# Patient Record
Sex: Male | Born: 1975 | Race: White | Hispanic: No | Marital: Single | Smoking: Never smoker
Health system: Southern US, Community
[De-identification: ages and names within clinical notes are randomized; demographics above are authoritative.]

## PROBLEM LIST (undated history)

## (undated) DIAGNOSIS — N201 Calculus of ureter: Secondary | ICD-10-CM

## (undated) DIAGNOSIS — Z87442 Personal history of urinary calculi: Secondary | ICD-10-CM

## (undated) DIAGNOSIS — N200 Calculus of kidney: Secondary | ICD-10-CM

## (undated) HISTORY — PX: NO PAST SURGERIES: SHX2092

---

## 2010-06-02 ENCOUNTER — Emergency Department (HOSPITAL_COMMUNITY): Payer: BC Managed Care – PPO

## 2010-06-02 ENCOUNTER — Emergency Department (HOSPITAL_COMMUNITY)
Admission: EM | Admit: 2010-06-02 | Discharge: 2010-06-02 | Disposition: A | Discharge status: Home / Self Care | Payer: BC Managed Care – PPO | Attending: Emergency Medicine | Admitting: Emergency Medicine

## 2010-06-02 DIAGNOSIS — K7689 Other specified diseases of liver: Secondary | ICD-10-CM | POA: Insufficient documentation

## 2010-06-02 DIAGNOSIS — R109 Unspecified abdominal pain: Secondary | ICD-10-CM | POA: Insufficient documentation

## 2010-06-02 DIAGNOSIS — N201 Calculus of ureter: Secondary | ICD-10-CM | POA: Insufficient documentation

## 2010-06-02 LAB — URINALYSIS, ROUTINE W REFLEX MICROSCOPIC
Ketones, ur: NEGATIVE mg/dL
Leukocytes, UA: NEGATIVE
Protein, ur: 30 mg/dL — AB
Urobilinogen, UA: 0.2 mg/dL (ref 0.0–1.0)

## 2010-06-02 LAB — POCT I-STAT, CHEM 8
Hemoglobin: 15 g/dL (ref 13.0–17.0)
Sodium: 143 mEq/L (ref 135–145)
TCO2: 24 mmol/L (ref 0–100)

## 2010-06-02 LAB — URINE MICROSCOPIC-ADD ON

## 2010-06-04 ENCOUNTER — Ambulatory Visit (HOSPITAL_COMMUNITY)
Admission: EM | Admit: 2010-06-04 | Discharge: 2010-06-04 | Disposition: A | Discharge status: Home / Self Care | Payer: BC Managed Care – PPO | Source: Ambulatory Visit | Attending: Urology | Admitting: Urology

## 2010-06-04 ENCOUNTER — Ambulatory Visit (HOSPITAL_COMMUNITY): Payer: BC Managed Care – PPO

## 2010-06-04 DIAGNOSIS — N201 Calculus of ureter: Secondary | ICD-10-CM | POA: Insufficient documentation

## 2010-06-04 DIAGNOSIS — E669 Obesity, unspecified: Secondary | ICD-10-CM | POA: Insufficient documentation

## 2010-06-04 HISTORY — PX: EXTRACORPOREAL SHOCK WAVE LITHOTRIPSY: SHX1557

## 2013-02-25 ENCOUNTER — Other Ambulatory Visit: Payer: Self-pay | Admitting: Internal Medicine

## 2013-03-01 LAB — HEPATITIS A ANTIBODY, TOTAL: Hep A Total Ab: NONREACTIVE

## 2013-03-01 LAB — HEPATITIS C ANTIBODY: HCV Ab: NEGATIVE

## 2013-03-01 LAB — HEPATITIS B CORE ANTIBODY, TOTAL: Hep B Core Total Ab: NONREACTIVE

## 2013-03-04 LAB — HEPATITIS B E ANTIBODY: Hepatitis Be Antibody: NEGATIVE

## 2013-07-25 ENCOUNTER — Other Ambulatory Visit: Payer: Self-pay | Admitting: Internal Medicine

## 2014-02-17 ENCOUNTER — Other Ambulatory Visit: Payer: Self-pay | Admitting: Physician Assistant

## 2014-02-27 DIAGNOSIS — E559 Vitamin D deficiency, unspecified: Secondary | ICD-10-CM | POA: Insufficient documentation

## 2014-02-27 DIAGNOSIS — R0989 Other specified symptoms and signs involving the circulatory and respiratory systems: Secondary | ICD-10-CM | POA: Insufficient documentation

## 2014-02-27 DIAGNOSIS — E291 Testicular hypofunction: Secondary | ICD-10-CM | POA: Insufficient documentation

## 2014-03-01 ENCOUNTER — Encounter: Payer: Self-pay | Admitting: Internal Medicine

## 2014-06-01 ENCOUNTER — Encounter: Payer: Self-pay | Admitting: Internal Medicine

## 2014-06-09 ENCOUNTER — Ambulatory Visit (INDEPENDENT_AMBULATORY_CARE_PROVIDER_SITE_OTHER): Payer: Self-pay | Admitting: Internal Medicine

## 2014-06-09 DIAGNOSIS — R69 Illness, unspecified: Secondary | ICD-10-CM

## 2014-06-09 NOTE — Progress Notes (Signed)
Patient ID: Darren Reynolds, male   DOB: [birth year], 39 y.o.   MRN: [id number]  [facility]  O    S  H  O  W

## 2014-12-01 ENCOUNTER — Emergency Department (HOSPITAL_COMMUNITY)
Admission: EM | Admit: 2014-12-01 | Discharge: 2014-12-02 | Disposition: A | Discharge status: Home / Self Care | Payer: Self-pay | Attending: Emergency Medicine | Admitting: Emergency Medicine

## 2014-12-01 ENCOUNTER — Encounter (HOSPITAL_COMMUNITY): Payer: Self-pay | Admitting: Emergency Medicine

## 2014-12-01 DIAGNOSIS — E559 Vitamin D deficiency, unspecified: Secondary | ICD-10-CM | POA: Insufficient documentation

## 2014-12-01 DIAGNOSIS — Z79899 Other long term (current) drug therapy: Secondary | ICD-10-CM | POA: Insufficient documentation

## 2014-12-01 DIAGNOSIS — I10 Essential (primary) hypertension: Secondary | ICD-10-CM | POA: Insufficient documentation

## 2014-12-01 DIAGNOSIS — N390 Urinary tract infection, site not specified: Secondary | ICD-10-CM | POA: Insufficient documentation

## 2014-12-01 DIAGNOSIS — Z8639 Personal history of other endocrine, nutritional and metabolic disease: Secondary | ICD-10-CM | POA: Insufficient documentation

## 2014-12-01 DIAGNOSIS — Z87442 Personal history of urinary calculi: Secondary | ICD-10-CM | POA: Insufficient documentation

## 2014-12-01 HISTORY — DX: Calculus of kidney: N20.0

## 2014-12-01 LAB — URINALYSIS, ROUTINE W REFLEX MICROSCOPIC
Bilirubin Urine: NEGATIVE
Glucose, UA: 100 mg/dL — AB
KETONES UR: NEGATIVE mg/dL
NITRITE: POSITIVE — AB
PROTEIN: 30 mg/dL — AB
Specific Gravity, Urine: 1.024 (ref 1.005–1.030)
Urobilinogen, UA: 0.2 mg/dL (ref 0.0–1.0)
pH: 7 (ref 5.0–8.0)

## 2014-12-01 LAB — URINE MICROSCOPIC-ADD ON

## 2014-12-01 MED ORDER — CEPHALEXIN 500 MG PO CAPS
500.0000 mg | ORAL_CAPSULE | Freq: Once | ORAL | Status: AC
  Administered 2014-12-02: 500 mg via ORAL
  Filled 2014-12-01: qty 1

## 2014-12-01 MED ORDER — PHENAZOPYRIDINE HCL 100 MG PO TABS: 100.0000 mg | ORAL_TABLET | Freq: Three times a day (TID) | ORAL | Status: DC | PRN

## 2014-12-01 MED ORDER — CEPHALEXIN 500 MG PO CAPS: 500.0000 mg | ORAL_CAPSULE | Freq: Four times a day (QID) | ORAL | Status: DC

## 2014-12-01 MED ORDER — PHENAZOPYRIDINE HCL 100 MG PO TABS
100.0000 mg | ORAL_TABLET | Freq: Three times a day (TID) | ORAL | Status: DC
  Administered 2014-12-02: 100 mg via ORAL
  Filled 2014-12-01: qty 1

## 2014-12-01 NOTE — ED Notes (Signed)
Pt says he felt "pressure down there" yesterday. Took AZO yesterday which relieved the pressure but did not today. Has had increased pressure today with burning at the end of urination. Has had UTIs and kidney stones before and says this feels like a UTI. No other c/c.

## 2014-12-01 NOTE — Discharge Instructions (Signed)
Urinary Tract Infection Follow up with [facility] for further evaluation of UTI. Take antibiotics as prescribed.  Urinary tract infections (UTIs) can develop anywhere along your urinary tract. Your urinary tract is your body's drainage system for removing wastes and extra water. Your urinary tract includes two kidneys, two ureters, a bladder, and a urethra. Your kidneys are a pair of bean-shaped organs. Each kidney is about the size of your fist. They are located below your ribs, one on each side of your spine. CAUSES Infections are caused by microbes, which are microscopic organisms, including fungi, viruses, and bacteria. These organisms are so small that they can only be seen through a microscope. Bacteria are the microbes that most commonly cause UTIs. SYMPTOMS  Symptoms of UTIs may vary by age and gender of the patient and by the location of the infection. Symptoms in young women typically include a frequent and intense urge to urinate and a painful, burning feeling in the bladder or urethra during urination. Older women and men are more likely to be tired, shaky, and weak and have muscle aches and abdominal pain. A fever may mean the infection is in your kidneys. Other symptoms of a kidney infection include pain in your back or sides below the ribs, nausea, and vomiting. DIAGNOSIS To diagnose a UTI, your caregiver will ask you about your symptoms. Your caregiver also will ask to provide a urine sample. The urine sample will be tested for bacteria and white blood cells. White blood cells are made by your body to help fight infection. TREATMENT  Typically, UTIs can be treated with medication. Because most UTIs are caused by a bacterial infection, they usually can be treated with the use of antibiotics. The choice of antibiotic and length of treatment depend on your symptoms and the type of bacteria causing your infection. HOME CARE INSTRUCTIONS  If you were prescribed antibiotics, take them  exactly as your caregiver instructs you. Finish the medication even if you feel better after you have only taken some of the medication.  Drink enough water and fluids to keep your urine clear or pale yellow.  Avoid caffeine, tea, and carbonated beverages. They tend to irritate your bladder.  Empty your bladder often. Avoid holding urine for long periods of time.  Empty your bladder before and after sexual intercourse.  After a bowel movement, women should cleanse from front to back. Use each tissue only once. SEEK MEDICAL CARE IF:   You have back pain.  You develop a fever.  Your symptoms do not begin to resolve within 3 days. SEEK IMMEDIATE MEDICAL CARE IF:   You have severe back pain or lower abdominal pain.  You develop chills.  You have nausea or vomiting.  You have continued burning or discomfort with urination. MAKE SURE YOU:   Understand these instructions.  Will watch your condition.  Will get help right away if you are not doing well or get worse. Document Released: 01/23/2005 Document Revised: 10/15/2011 Document Reviewed: 05/24/2011 [facility] Patient Information 2015 [place], [place]. This information is not intended to replace advice given to you by your health care provider. Make sure you discuss any questions you have with your health care provider.

## 2014-12-01 NOTE — ED Provider Notes (Signed)
CSN: [id number]     Arrival date & time 12/01/14  2145 History   First MD Initiated Contact with Patient 12/01/14 2227     Chief Complaint  Patient presents with  . Bladder pressure   . Urinary Tract Infection     (Consider location/radiation/quality/duration/timing/severity/associated sxs/prior Treatment) The history is provided by the patient and a relative. No language interpreter was used.   Darren Reynolds is a 39 y.o male with a hsitory of hypertension, kidney stones, UTI who presents for pelvic pressure for the past 5 days with dysuria and hematura. He states he took AZO yesterday and pickle juice for the past 5 days which relieved the pressure but his symptoms returned today. He states the he had this in the past he had a UTI after having kidney stones. He states he had a fever 2 days ago. He denies having multiple sexual partners or a history of STD's. He denies any nausea, vomiting, urinary frequency, testicular or penile pain, back pain.  Past Medical History  Diagnosis Date  . Hypogonadism in male   . Vitamin D deficiency   . Labile hypertension   . Nephrolithiasis    History reviewed. No pertinent past surgical history. Family History  Problem Relation Age of Onset  . Heart disease Paternal Uncle   . Stroke Maternal Grandmother   . Hypertension Maternal Grandfather   . Stroke Maternal Grandfather   . Heart attack Maternal Grandfather    History  Substance Use Topics  . Smoking status: Never Smoker   . Smokeless tobacco: Never Used  . Alcohol Use: No    Review of Systems  Constitutional: Negative for chills.  Gastrointestinal: Negative for abdominal pain.  Genitourinary: Positive for dysuria and hematuria. Negative for decreased urine volume, discharge, penile pain and testicular pain.  All other systems reviewed and are negative.     Allergies  Review of patient's allergies indicates no known allergies.  Home Medications   Prior to Admission medications    Medication Sig Start Date End Date Taking? Authorizing Provider  naproxen sodium (ANAPROX) 220 MG tablet Take 440 mg by mouth 2 (two) times daily with a meal.   Yes Historical Provider, MD  cephALEXin (KEFLEX) 500 MG capsule Take 1 capsule (500 mg total) by mouth 4 (four) times daily. 12/01/14   Deb Loudin Patel-Mills, PA-C  citalopram (CELEXA) 40 MG tablet TAKE ONE TABLET BY MOUTH ONE TIME DAILY FOR MOOD Patient not taking: Reported on 12/01/2014 02/19/14   Quentin Mulling, PA-C  phenazopyridine (PYRIDIUM) 100 MG tablet Take 1 tablet (100 mg total) by mouth 3 (three) times daily as needed for pain. 12/01/14   Beuford Garcilazo Patel-Mills, PA-C   BP 125/90 mmHg  Pulse 93  Temp(Src) 98.6 F (37 C) (Oral)  Resp 16  Ht  (1.778 m)  Wt 292 lb 3.2 oz (132.541 kg)  BMI 41.93 kg/m2  SpO2 99% Physical Exam  Constitutional: He is oriented to person, place, and time. He appears well-developed and well-nourished. No distress.  HENT:  Head: Normocephalic and atraumatic.  Eyes: Conjunctivae are normal.  Neck: Normal range of motion. Neck supple.  Cardiovascular: Normal rate, regular rhythm and normal heart sounds.   Pulmonary/Chest: Effort normal and breath sounds normal.  Abdominal: Soft. He exhibits no distension and no mass. There is no tenderness. There is no rigidity, no rebound, no guarding and no CVA tenderness.  No pelvic or CVA tenderness to palpation.  Musculoskeletal: Normal range of motion.  Neurological: He is alert and oriented  to person, place, and time.  Skin: Skin is warm and dry.  Psychiatric: He has a normal mood and affect. His behavior is normal.  Nursing note and vitals reviewed.   ED Course  Procedures (including critical care time) Labs Review Labs Reviewed  URINALYSIS, ROUTINE W REFLEX MICROSCOPIC (NOT AT [facility]) - Abnormal; Notable for the following:    APPearance CLOUDY (*)    Glucose, UA 100 (*)    Hgb urine dipstick LARGE (*)    Protein, ur 30 (*)    Nitrite POSITIVE (*)     Leukocytes, UA LARGE (*)    All other components within normal limits  URINE MICROSCOPIC-ADD ON - Abnormal; Notable for the following:    Bacteria, UA MANY (*)    All other components within normal limits  URINE CULTURE    Imaging Review No results found.   EKG Interpretation None      MDM   Final diagnoses:  Urinary tract infection, acute   Patient presents for pelvic pressure and dysuria for the last 5 days. He is afebrile. Urinalysis shows positive nitrates and large leukocytes.  I treated him for UTI with keflex and pyridium. I discussed that it is unusual for males to have UTI's without another cause and that it is important to follow up with urology.  He agreed with the plan and had no unaddressed concerns.  Urine culture pending.  Medications  phenazopyridine (PYRIDIUM) tablet 100 mg (100 mg Oral Given 12/02/14 0006)  cephALEXin (KEFLEX) capsule 500 mg (500 mg Oral Given 12/02/14 0006)       Catha Gosselin, PA-C 12/02/14 0033  Rolland Porter, MD 12/06/14 [phone]

## 2014-12-04 LAB — URINE CULTURE: Special Requests: NORMAL

## 2014-12-06 ENCOUNTER — Telehealth (HOSPITAL_COMMUNITY): Payer: Self-pay

## 2014-12-06 NOTE — Telephone Encounter (Signed)
Post ED Visit - Positive Culture Follow-up  Culture report reviewed by antimicrobial stewardship pharmacist:  Wes Dulaney, Pharm.D., BCPS  Celedonio Miyamoto, Pharm.D., BCPS  Georgina Pillion, Pharm.D., BCPS  [place], [place].D., BCPS, AAHIVP  Estella Husk, Pharm.D., BCPS, AAHIVP  Elder Cyphers, [address].D., BCPS  Positive Urine culture, >/= 100,000 -> E Coli Treated with Cephalexin, organism sensitive to the same and no further patient follow-up is required at this time.  Arvid Right 12/06/2014, 3:57 AM

## 2015-03-06 ENCOUNTER — Encounter: Payer: Self-pay | Admitting: Internal Medicine

## 2015-06-15 ENCOUNTER — Encounter: Payer: Self-pay | Admitting: Internal Medicine

## 2015-06-26 ENCOUNTER — Encounter: Payer: Self-pay | Admitting: Internal Medicine

## 2015-10-16 ENCOUNTER — Ambulatory Visit: Payer: Self-pay | Admitting: Internal Medicine

## 2015-10-16 ENCOUNTER — Encounter: Payer: Self-pay | Admitting: Internal Medicine

## 2015-10-16 VITALS — BP 142/86 | HR 92 | Temp 97.5°F | Resp 16 | Ht 70.5 in | Wt 258.8 lb

## 2015-10-16 DIAGNOSIS — F4321 Adjustment disorder with depressed mood: Secondary | ICD-10-CM

## 2015-10-16 MED ORDER — CITALOPRAM HYDROBROMIDE 40 MG PO TABS: ORAL_TABLET | ORAL | Status: DC

## 2015-10-16 NOTE — Progress Notes (Deleted)
Patient ID: Darren Reynolds, male   DOB: 1975/11/22, 40 y.o.   MRN: [id number]

## 2015-10-16 NOTE — Progress Notes (Signed)
  Subjective:    Patient ID: Darren Reynolds, male    DOB: 24-Dec-1975, 40 y.o.   MRN: [id number]  HPI  This nice 40 yo DWM present with situational depression after a failed 5114 yr marriage & divorcing in 242015 and sharing custody of his 40 yo daughter with his ex-wife . Today he reports depression over a 2 yr long distance (3 hr drive) relationship that seems to be going "bad" with suspicions that the girlfriend has lost interest due to the separation in time & space and he is having difficulty accepting the situation. He had been on Citalopram in the past and desires to restart. He denies any SI.    Medication Sig   No facility-administered medications prior to visit.   No Known Allergies   Past Medical History  Diagnosis Date  . Hypogonadism in male   . Vitamin D deficiency   . Labile hypertension   . Nephrolithiasis    Review of Systems  10 point systems review negative except as above.     Objective:   Physical Exam  BP 142/86 mmHg  Pulse 92  Temp(Src) 97.5 F (36.4 C)  Resp 16  Ht 5' 10.5" (1.791 m)  Wt 258 lb 12.8 oz (117.391 kg)  BMI 36.60 kg/m2  HEENT - WNL Neck - supple.  Chest - Clear. Cor - Nl HS. RRR w/o sig MGR. MS- FROM w/o deformities. Neuro - Nl w/o focal abnormalities. Psyche - Mental status flat & depressed.  No delusions, ideations or obvious mood abnormalities.    Assessment & Plan:   1) Reactive Depression  Rx- Citalopram 40 mg #90 x 1 rf  Take 1 tablet daily   Discussed meds & SE's & recc ROV 6 weeks or sooner.

## 2015-10-16 NOTE — Patient Instructions (Signed)
Major Depressive Disorder Major depressive disorder is a mental illness. It also may be called clinical depression or unipolar depression. Major depressive disorder usually causes feelings of sadness, hopelessness, or helplessness. Some people with this disorder do not feel particularly sad but lose interest in doing things they used to enjoy (anhedonia). Major depressive disorder also can cause physical symptoms. It can interfere with work, school, relationships, and other normal everyday activities. The disorder varies in severity but is longer lasting and more serious than the sadness we all feel from time to time in our lives. Major depressive disorder often is triggered by stressful life events or major life changes. Examples of these triggers include divorce, loss of your job or home, a move, and the death of a family member or close friend. Sometimes this disorder occurs for no obvious reason at all. People who have family members with major depressive disorder or bipolar disorder are at higher risk for developing this disorder, with or without life stressors. Major depressive disorder can occur at any age. It may occur just once in your life (single episode major depressive disorder). It may occur multiple times (recurrent major depressive disorder). SYMPTOMS People with major depressive disorder have either anhedonia or depressed mood on nearly a daily basis for at least 2 weeks or longer. Symptoms of depressed mood include:  Feelings of sadness (blue or down in the dumps) or emptiness.  Feelings of hopelessness or helplessness.  Tearfulness or episodes of crying (may be observed by others).  Irritability (children and adolescents). In addition to depressed mood or anhedonia or both, people with this disorder have at least four of the following symptoms:  Difficulty sleeping or sleeping too much.   Significant change (increase or decrease) in appetite or weight.   Lack of energy or  motivation.  Feelings of guilt and worthlessness.   Difficulty concentrating, remembering, or making decisions.  Unusually slow movement (psychomotor retardation) or restlessness (as observed by others).   Recurrent wishes for death, recurrent thoughts of self-harm (suicide), or a suicide attempt. People with major depressive disorder commonly have persistent negative thoughts about themselves, other people, and the world. People with severe major depressive disorder may experiencedistorted beliefs or perceptions about the world (psychotic delusions). They also may see or hear things that are not real (psychotic hallucinations). DIAGNOSIS Major depressive disorder is diagnosed through an assessment by your health care provider. Your health care provider will ask aboutaspects of your daily life, such as mood,sleep, and appetite, to see if you have the diagnostic symptoms of major depressive disorder. Your health care provider may ask about your medical history and use of alcohol or drugs, including prescription medicines. Your health care provider also may do a physical exam and blood work. This is because certain medical conditions and the use of certain substances can cause major depressive disorder-like symptoms (secondary depression). Your health care provider also may refer you to a mental health specialist for further evaluation and treatment. TREATMENT It is important to recognize the symptoms of major depressive disorder and seek treatment. The following treatments can be prescribed for this disorder:   Medicine. Antidepressant medicines usually are prescribed. Antidepressant medicines are thought to correct chemical imbalances in the brain that are commonly associated with major depressive disorder. Other types of medicine may be added if the symptoms do not respond to antidepressant medicines alone or if psychotic delusions or hallucinations occur.  Talk therapy. Talk therapy can be  helpful in treating major depressive disorder by providing   support, education, and guidance. Certain types of talk therapy also can help with negative thinking (cognitive behavioral therapy) and with relationship issues that trigger this disorder (interpersonal therapy). A mental health specialist can help determine which treatment is best for you. Most people with major depressive disorder do well with a combination of medicine and talk therapy. Treatments involving electrical stimulation of the brain can be used in situations with extremely severe symptoms or when medicine and talk therapy do not work over time. These treatments include electroconvulsive therapy, transcranial magnetic stimulation, and vagal nerve stimulation.   This information is not intended to replace advice given to you by your health care provider. Make sure you discuss any questions you have with your health care provider.   Document Released: 08/10/2012 Document Revised: 05/06/2014 Document Reviewed: 08/10/2012 Elsevier Interactive Patient Education 2016 [employer] Inc[employer]  

## 2015-11-29 ENCOUNTER — Ambulatory Visit: Payer: Self-pay | Admitting: Internal Medicine

## 2015-12-02 ENCOUNTER — Encounter: Payer: Self-pay | Admitting: Family Medicine

## 2015-12-02 ENCOUNTER — Ambulatory Visit (INDEPENDENT_AMBULATORY_CARE_PROVIDER_SITE_OTHER): Payer: Self-pay

## 2015-12-02 ENCOUNTER — Ambulatory Visit (HOSPITAL_BASED_OUTPATIENT_CLINIC_OR_DEPARTMENT_OTHER)
Admission: RE | Admit: 2015-12-02 | Discharge: 2015-12-02 | Disposition: A | Discharge status: Home / Self Care | Payer: Self-pay | Source: Ambulatory Visit | Attending: Family Medicine | Admitting: Family Medicine

## 2015-12-02 ENCOUNTER — Ambulatory Visit (INDEPENDENT_AMBULATORY_CARE_PROVIDER_SITE_OTHER): Payer: Self-pay | Admitting: Family Medicine

## 2015-12-02 VITALS — BP 136/88 | HR 86 | Temp 98.2°F | Resp 18 | Ht 70.5 in | Wt 256.0 lb

## 2015-12-02 DIAGNOSIS — M545 Low back pain, unspecified: Secondary | ICD-10-CM

## 2015-12-02 DIAGNOSIS — K5730 Diverticulosis of large intestine without perforation or abscess without bleeding: Secondary | ICD-10-CM | POA: Insufficient documentation

## 2015-12-02 DIAGNOSIS — R319 Hematuria, unspecified: Secondary | ICD-10-CM

## 2015-12-02 DIAGNOSIS — Z87442 Personal history of urinary calculi: Secondary | ICD-10-CM

## 2015-12-02 DIAGNOSIS — N132 Hydronephrosis with renal and ureteral calculous obstruction: Secondary | ICD-10-CM | POA: Insufficient documentation

## 2015-12-02 DIAGNOSIS — R109 Unspecified abdominal pain: Secondary | ICD-10-CM

## 2015-12-02 DIAGNOSIS — K760 Fatty (change of) liver, not elsewhere classified: Secondary | ICD-10-CM | POA: Insufficient documentation

## 2015-12-02 LAB — POC MICROSCOPIC URINALYSIS (UMFC): Mucus: ABSENT

## 2015-12-02 LAB — POCT URINALYSIS DIP (MANUAL ENTRY)
BILIRUBIN UA: NEGATIVE
Bilirubin, UA: NEGATIVE
Glucose, UA: NEGATIVE
LEUKOCYTES UA: NEGATIVE
Nitrite, UA: NEGATIVE
PH UA: 5.5
PROTEIN UA: NEGATIVE
SPEC GRAV UA: 1.02
Urobilinogen, UA: 0.2

## 2015-12-02 LAB — BASIC METABOLIC PANEL
BUN: 8 mg/dL (ref 7–25)
CHLORIDE: 103 mmol/L (ref 98–110)
CO2: 26 mmol/L (ref 20–31)
Calcium: 9.8 mg/dL (ref 8.6–10.3)
Creat: 1.19 mg/dL (ref 0.60–1.35)
GLUCOSE: 88 mg/dL (ref 65–99)
POTASSIUM: 5.1 mmol/L (ref 3.5–5.3)
Sodium: 138 mmol/L (ref 135–146)

## 2015-12-02 MED ORDER — HYDROCODONE-ACETAMINOPHEN 5-325 MG PO TABS: 1.0000 | ORAL_TABLET | Freq: Four times a day (QID) | ORAL | 0 refills | Status: DC | PRN

## 2015-12-02 MED ORDER — KETOROLAC TROMETHAMINE 60 MG/2ML IM SOLN
60.0000 mg | Freq: Once | INTRAMUSCULAR | Status: AC
  Administered 2015-12-02: 60 mg via INTRAMUSCULAR

## 2015-12-02 NOTE — Patient Instructions (Addendum)
Your urine test does indicate most likely this is another kidney stone. I will obtain a CAT scan today to look at location of stone and  size to determine next step. For now take hydrocodone up to every 6 hours as needed for pain, make sure you drink sufficient fluids, and will call you once we have the results of the CT scan.  When urinating, use filter and if any stones or pass, return with those so this can be analyzed to determine the cause of your kidney stones.   Kidney Stones Kidney stones (urolithiasis) are deposits that form inside your kidneys. The intense pain is caused by the stone moving through the urinary tract. When the stone moves, the ureter goes into spasm around the stone. The stone is usually passed in the urine.  CAUSES   A disorder that makes certain neck glands produce too much parathyroid hormone (primary hyperparathyroidism).  A buildup of uric acid crystals, similar to gout in your joints.  Narrowing (stricture) of the ureter.  A kidney obstruction present at birth (congenital obstruction).  Previous surgery on the kidney or ureters.  Numerous kidney infections. SYMPTOMS   Feeling sick to your stomach (nauseous).  Throwing up (vomiting).  Blood in the urine (hematuria).  Pain that usually spreads (radiates) to the groin.  Frequency or urgency of urination. DIAGNOSIS   Taking a history and physical exam.  Blood or urine tests.  CT scan.  Occasionally, an examination of the inside of the urinary bladder (cystoscopy) is performed. TREATMENT   Observation.  Increasing your fluid intake.  Extracorporeal shock wave lithotripsy--This is a noninvasive procedure that uses shock waves to break up kidney stones.  Surgery may be needed if you have severe pain or persistent obstruction. There are various surgical procedures. Most of the procedures are performed with the use of small instruments. Only small incisions are needed to accommodate these  instruments, so recovery time is minimized. The size, location, and chemical composition are all important variables that will determine the proper choice of action for you. Talk to your health care provider to better understand your situation so that you will minimize the risk of injury to yourself and your kidney.  HOME CARE INSTRUCTIONS   Drink enough water and fluids to keep your urine clear or pale yellow. This will help you to pass the stone or stone fragments.  Strain all urine through the provided strainer. Keep all particulate matter and stones for your health care provider to see. The stone causing the pain may be as small as a grain of salt. It is very important to use the strainer each and every time you pass your urine. The collection of your stone will allow your health care provider to analyze it and verify that a stone has actually passed. The stone analysis will often identify what you can do to reduce the incidence of recurrences.  Only take over-the-counter or prescription medicines for pain, discomfort, or fever as directed by your health care provider.  Keep all follow-up visits as told by your health care provider. This is important.  Get follow-up X-rays if required. The absence of pain does not always mean that the stone has passed. It may have only stopped moving. If the urine remains completely obstructed, it can cause loss of kidney function or even complete destruction of the kidney. It is your responsibility to make sure X-rays and follow-ups are completed. Ultrasounds of the kidney can show blockages and the status of the  kidney. Ultrasounds are not associated with any radiation and can be performed easily in a matter of minutes.  Make changes to your daily diet as told by your health care provider. You may be told to:  Limit the amount of salt that you eat.  Eat 5 or more servings of fruits and vegetables each day.  Limit the amount of meat, poultry, fish, and eggs  that you eat.  Collect a 24-hour urine sample as told by your health care provider.You may need to collect another urine sample every 6-12 months. SEEK MEDICAL CARE IF:  You experience pain that is progressive and unresponsive to any pain medicine you have been prescribed. SEEK IMMEDIATE MEDICAL CARE IF:   Pain cannot be controlled with the prescribed medicine.  You have a fever or shaking chills.  The severity or intensity of pain increases over 18 hours and is not relieved by pain medicine.  You develop a new onset of abdominal pain.  You feel faint or pass out.  You are unable to urinate.   This information is not intended to replace advice given to you by your health care provider. Make sure you discuss any questions you have with your health care provider.   Document Released: 04/15/2005 Document Revised: 01/04/2015 Document Reviewed: 09/16/2012 Elsevier Interactive Patient Education 2016 [employer].   IF you received an x-ray today, you will receive an invoice from [facility]. Please contact [facility] at [phone] with questions or concerns regarding your invoice.   IF you received labwork today, you will receive an invoice from [employer]. Please contact [facility] at [phone] with questions or concerns regarding your invoice.   Our billing staff will not be able to assist you with questions regarding bills from these companies.  You will be contacted with the lab results as soon as they are available. The fastest way to get your results is to activate your My Chart account. Instructions are located on the last page of this paperwork. If you have not heard from [place] regarding the results in 2 weeks, please contact this office.

## 2015-12-02 NOTE — Progress Notes (Addendum)
By signing my name below I, [name], attest that this documentation has been prepared under the direction and in the presence of Shade Flood, MD. Electonically Signed. [name], Scribe 12/02/2015 at 3:01 PM   Subjective:    Patient ID: Darren Reynolds, male    DOB: 23-May-1975, 40 y.o.   MRN: [id number]  Chief Complaint  Patient presents with  . Back Pain    lower left side/ x 3 days  . Flank Pain    pt states he has had kidney stones in past/ pt would like his gallbadder check    HPI Darren Reynolds is a 40 y.o. male who presents to the [facility] complaining of sharp stabbing left sided flank pain that started 5 days ago. Pain worse today and has become constant. Pain starts after he finishes urinating. Pain has started to radiate around to his left abd. Pt has been drinking a lot of water which was reducing pain until today. No dysuria, urinary hesitancy, urinary frequency, or hematuria. Pt has history of kidney stone in 2012. Pt states his pain is similar to prior kidney stone. Pt given a muscle relaxer by wife for pain with no relief in pain. Pt was taking advil for pain with minimal relief. Pt denies taking advil today. Pt also reports nausea today. Pt denies vomiting. Pain is worsened with deep breaths. Pt denies SOB, CP, fever, or rash.  Pt denies any recent heavy lifting or back strain.   Treated for UTI a year ago, with discussion to follow up with urology. Pt did not follow up with urology at that time.  Evaluated by Dr Patsi Sears; urologist for a right kidney stone in 2012.  Patient Active Problem List   Diagnosis Date Noted  . Hypogonadism in male   . Vitamin D deficiency   . Labile hypertension    Past Medical History:  Diagnosis Date  . Hypogonadism in male   . Labile hypertension   . Nephrolithiasis   . Vitamin D deficiency    No past surgical history on file. No Known Allergies Prior to Admission medications   Medication  Sig Start Date End Date Taking? Authorizing Provider  citalopram (CELEXA) 40 MG tablet TAKE ONE TABLET BY MOUTH ONE TIME DAILY FOR MOOD Patient not taking: Reported on 12/02/2015 10/16/15 04/16/16  Lucky Cowboy, MD   Social History   Social History  . Marital status: Single    Spouse name: N/A  . Number of children: N/A  . Years of education: N/A   Occupational History  . Not on file.   Social History Main Topics  . Smoking status: Never Smoker  . Smokeless tobacco: Never Used  . Alcohol use No  . Drug use: No  . Sexual activity: Not on file   Other Topics Concern  . Not on file   Social History Narrative  . No narrative on file      Review of Systems  Constitutional: Negative for fever.  Respiratory: Negative for shortness of breath.   Cardiovascular: Negative for chest pain.  Gastrointestinal: Positive for nausea. Negative for abdominal pain and vomiting.  Genitourinary: Positive for flank pain (left). Negative for dysuria, frequency and hematuria.  Skin: Negative for rash.       Objective:   Physical Exam  Constitutional: He is oriented to person, place, and time. He appears well-developed and well-nourished.  HENT:  Head: Normocephalic and atraumatic.  Eyes: EOM are normal. Pupils are equal, round, and  reactive to light.  Neck: No JVD present. Carotid bruit is not present.  Cardiovascular: Normal rate, regular rhythm and normal heart sounds.  Exam reveals no gallop and no friction rub.   No murmur heard. Pulmonary/Chest: Effort normal and breath sounds normal. He has no decreased breath sounds. He has no wheezes. He has no rhonchi. He has no rales.  Abdominal: Soft. Normal appearance and bowel sounds are normal. There is no tenderness. There is CVA tenderness (left). There is negative Murphy's sign.  Musculoskeletal: He exhibits no edema.       Lumbar back: He exhibits tenderness (left paralumbar tenderness). He exhibits no bony tenderness.  Pt has slight  increase in his flank pain with extension of back, otherwise pain unaffected by movement.   Neurological: He is alert and oriented to person, place, and time.  Skin: Skin is warm and dry.  Psychiatric: He has a normal mood and affect.  Vitals reviewed.   Vitals:   12/02/15 1402  BP: 136/88  Pulse: 86  Resp: 18  Temp: 98.2 F (36.8 C)  TempSrc: Oral  SpO2: 98%  Weight: 256 lb (116.1 kg)  Height: 5' 10.5" (1.791 m)   Results for orders placed or performed in visit on 12/02/15  POCT urinalysis dipstick  Result Value Ref Range   Color, UA yellow yellow   Clarity, UA clear clear   Glucose, UA negative negative   Bilirubin, UA negative negative   Ketones, POC UA negative negative   Spec Grav, UA 1.020    Blood, UA large (A) negative   pH, UA 5.5    Protein Ur, POC negative negative   Urobilinogen, UA 0.2    Nitrite, UA Negative Negative   Leukocytes, UA Negative Negative  POCT Microscopic Urinalysis ([facility])  Result Value Ref Range   WBC,UR,HPF,POC None None WBC/hpf   RBC,UR,HPF,POC Too numerous to count  (A) None RBC/hpf   Bacteria None None, Too numerous to count   Mucus Absent Absent   Epithelial Cells, UR Per Microscopy Few (A) None, Too numerous to count cells/hpf    Abd Xray viewed and interpreted by Dr Neva Seat. Dg Abd 1 View  Result Date: 12/02/2015 CLINICAL DATA:  LEFT back pain and flank pain EXAM: ABDOMEN - 1 VIEW COMPARISON:  None. FINDINGS: 9 mm LEFT renal calculus. a LEFT ureterolithiasis projecting over the transverse process of the L3 vertebral body. No bladder calculi. Potential. IMPRESSION: 1. LEFT nephrolithiasis. 2. Potential LEFT ureteral calculus. Consider CT of the abdomen/pelvis for further evaluation Electronically Signed   By: Genevive Bi M.D.   On: 12/02/2015 15:24         Assessment & Plan:    Darren Reynolds is a 40 y.o. male Left flank pain - Plan: POCT urinalysis dipstick, POCT Microscopic Urinalysis ([facility]), DG Abd 1 View, Basic  metabolic panel, ketorolac (TORADOL) injection 60 mg, CT RENAL STONE STUDY, CANCELED: CT RENAL STONE STUDY  Left-sided low back pain without sciatica - Plan: POCT urinalysis dipstick, POCT Microscopic Urinalysis ([facility]), DG Abd 1 View, Basic metabolic panel, ketorolac (TORADOL) injection 60 mg, CT RENAL STONE STUDY, CANCELED: CT RENAL STONE STUDY  History of nephrolithiasis - Plan: POCT urinalysis dipstick, POCT Microscopic Urinalysis ([facility]), DG Abd 1 View, Basic metabolic panel, ketorolac (TORADOL) injection 60 mg, CT RENAL STONE STUDY, CANCELED: CT RENAL STONE STUDY  Hematuria - Plan: CT RENAL STONE STUDY, CANCELED: CT RENAL STONE STUDY  Hematuria without signs of infection. Flank pain, suspicious for left-sided nephrolithiasis recurrence. 5 days of  symptoms. KUB with 2 possible areas of calcification concerning for stone, ranging from 7-9 mm.  -Toradol 60 mg IM given, with moderate improvement in pain in office.  -Check CT renal study today to eval for stone, location, and size.  -Push fluids, and hydrocodone every 6 hours when necessary pain. Side effects discussed, #20.  -Depending on CT, will discuss with urologist.  10:09 PM  CT results received: IMPRESSION: 1. Partially obstructive 8 mm calculus in the proximal third of the left ureter immediately beyond the left ureteropelvic junction with mild proximal hydroureteronephrosis. 2. Multiple additional nonobstructive calculi within the collecting systems of the kidneys bilaterally (left greater than right), measuring up to 8 mm in the interpolar region. 3. Hepatic steatosis. 4. Mild colonic diverticulosis without evidence of acute diverticulitis at this time.  Results discussed with patient. He is not currently having pain, has been feeling well since he left the office. Mild hydronephrosis noted. Plan on follow-up Monday (2 days), and then can discuss plan with urology given size of 8 mm stone. ER precautions prior that time  discussed.    Meds ordered this encounter  Medications  . ketorolac (TORADOL) injection 60 mg  . HYDROcodone-acetaminophen (NORCO/VICODIN) 5-325 MG tablet    Sig: Take 1 tablet by mouth every 6 (six) hours as needed for moderate pain.    Dispense:  20 tablet    Refill:  0   Patient Instructions    Your urine test does indicate most likely this is another kidney stone. I will obtain a CAT scan today to look at location of stone and  size to determine next step. For now take hydrocodone up to every 6 hours as needed for pain, make sure you drink sufficient fluids, and will call you once we have the results of the CT scan.  When urinating, use filter and if any stones or pass, return with those so this can be analyzed to determine the cause of your kidney stones.   Kidney Stones Kidney stones (urolithiasis) are deposits that form inside your kidneys. The intense pain is caused by the stone moving through the urinary tract. When the stone moves, the ureter goes into spasm around the stone. The stone is usually passed in the urine.  CAUSES   A disorder that makes certain neck glands produce too much parathyroid hormone (primary hyperparathyroidism).  A buildup of uric acid crystals, similar to gout in your joints.  Narrowing (stricture) of the ureter.  A kidney obstruction present at birth (congenital obstruction).  Previous surgery on the kidney or ureters.  Numerous kidney infections. SYMPTOMS   Feeling sick to your stomach (nauseous).  Throwing up (vomiting).  Blood in the urine (hematuria).  Pain that usually spreads (radiates) to the groin.  Frequency or urgency of urination. DIAGNOSIS   Taking a history and physical exam.  Blood or urine tests.  CT scan.  Occasionally, an examination of the inside of the urinary bladder (cystoscopy) is performed. TREATMENT   Observation.  Increasing your fluid intake.  Extracorporeal shock wave lithotripsy--This is a  noninvasive procedure that uses shock waves to break up kidney stones.  Surgery may be needed if you have severe pain or persistent obstruction. There are various surgical procedures. Most of the procedures are performed with the use of small instruments. Only small incisions are needed to accommodate these instruments, so recovery time is minimized. The size, location, and chemical composition are all important variables that will determine the proper choice of action for you.  Talk to your health care provider to better understand your situation so that you will minimize the risk of injury to yourself and your kidney.  HOME CARE INSTRUCTIONS   Drink enough water and fluids to keep your urine clear or pale yellow. This will help you to pass the stone or stone fragments.  Strain all urine through the provided strainer. Keep all particulate matter and stones for your health care provider to see. The stone causing the pain may be as small as a grain of salt. It is very important to use the strainer each and every time you pass your urine. The collection of your stone will allow your health care provider to analyze it and verify that a stone has actually passed. The stone analysis will often identify what you can do to reduce the incidence of recurrences.  Only take over-the-counter or prescription medicines for pain, discomfort, or fever as directed by your health care provider.  Keep all follow-up visits as told by your health care provider. This is important.  Get follow-up X-rays if required. The absence of pain does not always mean that the stone has passed. It may have only stopped moving. If the urine remains completely obstructed, it can cause loss of kidney function or even complete destruction of the kidney. It is your responsibility to make sure X-rays and follow-ups are completed. Ultrasounds of the kidney can show blockages and the status of the kidney. Ultrasounds are not associated with any  radiation and can be performed easily in a matter of minutes.  Make changes to your daily diet as told by your health care provider. You may be told to:  Limit the amount of salt that you eat.  Eat 5 or more servings of fruits and vegetables each day.  Limit the amount of meat, poultry, fish, and eggs that you eat.  Collect a 24-hour urine sample as told by your health care provider.You may need to collect another urine sample every 6-12 months. SEEK MEDICAL CARE IF:  You experience pain that is progressive and unresponsive to any pain medicine you have been prescribed. SEEK IMMEDIATE MEDICAL CARE IF:   Pain cannot be controlled with the prescribed medicine.  You have a fever or shaking chills.  The severity or intensity of pain increases over 18 hours and is not relieved by pain medicine.  You develop a new onset of abdominal pain.  You feel faint or pass out.  You are unable to urinate.   This information is not intended to replace advice given to you by your health care provider. Make sure you discuss any questions you have with your health care provider.   Document Released: 04/15/2005 Document Revised: 01/04/2015 Document Reviewed: 09/16/2012 Elsevier Interactive Patient Education 2016 [employer].   IF you received an x-ray today, you will receive an invoice from [facility]. Please contact [facility] at [phone] with questions or concerns regarding your invoice.   IF you received labwork today, you will receive an invoice from [employer]. Please contact [facility] at [phone] with questions or concerns regarding your invoice.   Our billing staff will not be able to assist you with questions regarding bills from these companies.  You will be contacted with the lab results as soon as they are available. The fastest way to get your results is to activate your My Chart account. Instructions are located on the last page  of this paperwork. If you have not heard from [place] regarding  the results in 2 weeks, please contact this office.        I personally performed the services described in this documentation, which was scribed in my presence. The recorded information has been reviewed and considered, and addended by me as needed.   Signed,   Meredith Staggers, MD [facility].  12/02/15 3:03 PM

## 2015-12-04 ENCOUNTER — Telehealth: Payer: Self-pay

## 2015-12-04 DIAGNOSIS — N200 Calculus of kidney: Secondary | ICD-10-CM

## 2015-12-04 NOTE — Telephone Encounter (Signed)
How is he feeling?  Did he pass any stones? I can call Urology Tuesday if needed to have him seen.

## 2015-12-04 NOTE — Telephone Encounter (Signed)
Patient is following up with Dr. Neva SeatGreene for kidney stones. Patient want to know what is the nest step. Maybe a referral to Urology. [phone].

## 2015-12-05 NOTE — Telephone Encounter (Signed)
Pt states he has not passed any stones and is taking pain medication you prescribed to manage the pain.

## 2015-12-05 NOTE — Telephone Encounter (Signed)
Called patient, left a voicemail that I discussed his condition with urology. Urology will be calling for appointment in the next day or 2. If any worsening of symptoms including fever, increasing pain, or other worsening prior to that appointment, should return to clinic or ER.

## 2015-12-07 ENCOUNTER — Other Ambulatory Visit: Payer: Self-pay | Admitting: Urology

## 2015-12-08 ENCOUNTER — Encounter (HOSPITAL_BASED_OUTPATIENT_CLINIC_OR_DEPARTMENT_OTHER): Payer: Self-pay | Admitting: *Deleted

## 2015-12-08 NOTE — Progress Notes (Signed)
NPO AFTER MN.  ARRIVE AT 0800.  NEEDS HG .   MAY TAKE PAIN RX IF NEEDED W/ SIPS OF WATER.

## 2015-12-11 ENCOUNTER — Ambulatory Visit (HOSPITAL_BASED_OUTPATIENT_CLINIC_OR_DEPARTMENT_OTHER): Payer: Self-pay | Admitting: Anesthesiology

## 2015-12-11 ENCOUNTER — Encounter (HOSPITAL_BASED_OUTPATIENT_CLINIC_OR_DEPARTMENT_OTHER)
Admission: RE | Disposition: A | Discharge status: Home / Self Care | Payer: Self-pay | Source: Ambulatory Visit | Attending: Urology

## 2015-12-11 ENCOUNTER — Encounter (HOSPITAL_BASED_OUTPATIENT_CLINIC_OR_DEPARTMENT_OTHER): Payer: Self-pay | Admitting: *Deleted

## 2015-12-11 ENCOUNTER — Ambulatory Visit (HOSPITAL_BASED_OUTPATIENT_CLINIC_OR_DEPARTMENT_OTHER)
Admission: RE | Admit: 2015-12-11 | Discharge: 2015-12-11 | Disposition: A | Discharge status: Home / Self Care | Payer: Self-pay | Source: Ambulatory Visit | Attending: Urology | Admitting: Urology

## 2015-12-11 DIAGNOSIS — Z79891 Long term (current) use of opiate analgesic: Secondary | ICD-10-CM | POA: Insufficient documentation

## 2015-12-11 DIAGNOSIS — N132 Hydronephrosis with renal and ureteral calculous obstruction: Secondary | ICD-10-CM | POA: Insufficient documentation

## 2015-12-11 DIAGNOSIS — Z79899 Other long term (current) drug therapy: Secondary | ICD-10-CM | POA: Insufficient documentation

## 2015-12-11 HISTORY — DX: Personal history of urinary calculi: Z87.442

## 2015-12-11 HISTORY — PX: CYSTOSCOPY/URETEROSCOPY/HOLMIUM LASER: SHX6545

## 2015-12-11 HISTORY — DX: Calculus of ureter: N20.1

## 2015-12-11 LAB — POCT HEMOGLOBIN-HEMACUE: HEMOGLOBIN: 15.8 g/dL (ref 13.0–17.0)

## 2015-12-11 SURGERY — CYSTOURETEROSCOPY, USING HOLMIUM LASER
Anesthesia: General | Site: Ureter | Laterality: Left

## 2015-12-11 MED ORDER — BELLADONNA ALKALOIDS-OPIUM 16.2-60 MG RE SUPP
RECTAL | Status: DC | PRN
  Administered 2015-12-11: 1 via RECTAL

## 2015-12-11 MED ORDER — CEFAZOLIN IN D5W 1 GM/50ML IV SOLN
1.0000 g | INTRAVENOUS | Status: DC
  Filled 2015-12-11: qty 50

## 2015-12-11 MED ORDER — FENTANYL CITRATE (PF) 100 MCG/2ML IJ SOLN
INTRAMUSCULAR | Status: DC | PRN
  Administered 2015-12-11 (×3): 50 ug via INTRAVENOUS

## 2015-12-11 MED ORDER — CEFAZOLIN SODIUM-DEXTROSE 2-4 GM/100ML-% IV SOLN
INTRAVENOUS | Status: AC
  Filled 2015-12-11: qty 100

## 2015-12-11 MED ORDER — FENTANYL CITRATE (PF) 100 MCG/2ML IJ SOLN
INTRAMUSCULAR | Status: AC
  Filled 2015-12-11: qty 2

## 2015-12-11 MED ORDER — TAMSULOSIN HCL 0.4 MG PO CAPS: 0.4000 mg | ORAL_CAPSULE | Freq: Every day | ORAL | 0 refills | Status: DC

## 2015-12-11 MED ORDER — LIDOCAINE HCL (CARDIAC) 20 MG/ML IV SOLN
INTRAVENOUS | Status: DC | PRN
  Administered 2015-12-11: 100 mg via INTRAVENOUS

## 2015-12-11 MED ORDER — SODIUM CHLORIDE 0.9 % IR SOLN
Status: DC | PRN
  Administered 2015-12-11: 4000 mL

## 2015-12-11 MED ORDER — PROPOFOL 10 MG/ML IV BOLUS
INTRAVENOUS | Status: AC
  Filled 2015-12-11: qty 40

## 2015-12-11 MED ORDER — ACETAMINOPHEN 10 MG/ML IV SOLN
INTRAVENOUS | Status: DC | PRN
  Administered 2015-12-11: 1000 mg via INTRAVENOUS

## 2015-12-11 MED ORDER — HYDROCODONE-ACETAMINOPHEN 5-325 MG PO TABS: 1.0000 | ORAL_TABLET | ORAL | 0 refills | Status: DC | PRN

## 2015-12-11 MED ORDER — ONDANSETRON HCL 4 MG/2ML IJ SOLN
INTRAMUSCULAR | Status: DC | PRN
  Administered 2015-12-11: 4 mg via INTRAVENOUS

## 2015-12-11 MED ORDER — CEFAZOLIN SODIUM-DEXTROSE 2-4 GM/100ML-% IV SOLN
2.0000 g | INTRAVENOUS | Status: AC
  Administered 2015-12-11: 2 g via INTRAVENOUS
  Filled 2015-12-11: qty 100

## 2015-12-11 MED ORDER — BELLADONNA ALKALOIDS-OPIUM 16.2-60 MG RE SUPP
RECTAL | Status: AC
  Filled 2015-12-11: qty 1

## 2015-12-11 MED ORDER — HYDROCODONE-ACETAMINOPHEN 7.5-325 MG PO TABS
1.0000 | ORAL_TABLET | Freq: Once | ORAL | Status: DC | PRN
  Filled 2015-12-11: qty 1

## 2015-12-11 MED ORDER — LIDOCAINE HCL 2 % EX GEL
CUTANEOUS | Status: DC | PRN
  Administered 2015-12-11: 1 via URETHRAL

## 2015-12-11 MED ORDER — LACTATED RINGERS IV SOLN
INTRAVENOUS | Status: DC
  Administered 2015-12-11 (×2): via INTRAVENOUS
  Filled 2015-12-11: qty 1000

## 2015-12-11 MED ORDER — DEXAMETHASONE SODIUM PHOSPHATE 10 MG/ML IJ SOLN
INTRAMUSCULAR | Status: AC
  Filled 2015-12-11: qty 1

## 2015-12-11 MED ORDER — ONDANSETRON HCL 4 MG/2ML IJ SOLN
INTRAMUSCULAR | Status: AC
  Filled 2015-12-11: qty 2

## 2015-12-11 MED ORDER — ARTIFICIAL TEARS OP OINT
TOPICAL_OINTMENT | OPHTHALMIC | Status: AC
  Filled 2015-12-11: qty 3.5

## 2015-12-11 MED ORDER — PROMETHAZINE HCL 25 MG/ML IJ SOLN
6.2500 mg | INTRAMUSCULAR | Status: DC | PRN
  Filled 2015-12-11: qty 1

## 2015-12-11 MED ORDER — MIDAZOLAM HCL 5 MG/5ML IJ SOLN
INTRAMUSCULAR | Status: DC | PRN
  Administered 2015-12-11: 2 mg via INTRAVENOUS

## 2015-12-11 MED ORDER — DEXAMETHASONE SODIUM PHOSPHATE 4 MG/ML IJ SOLN
INTRAMUSCULAR | Status: DC | PRN
  Administered 2015-12-11: 10 mg via INTRAVENOUS

## 2015-12-11 MED ORDER — FENTANYL CITRATE (PF) 100 MCG/2ML IJ SOLN
25.0000 ug | INTRAMUSCULAR | Status: DC | PRN
Start: 2015-12-11 — End: 2015-12-11
  Filled 2015-12-11: qty 1

## 2015-12-11 MED ORDER — LIDOCAINE HCL (CARDIAC) 20 MG/ML IV SOLN
INTRAVENOUS | Status: AC
  Filled 2015-12-11: qty 5

## 2015-12-11 MED ORDER — KETOROLAC TROMETHAMINE 30 MG/ML IJ SOLN
INTRAMUSCULAR | Status: AC
  Filled 2015-12-11: qty 1

## 2015-12-11 MED ORDER — PROPOFOL 10 MG/ML IV BOLUS
INTRAVENOUS | Status: DC | PRN
  Administered 2015-12-11: 50 mg via INTRAVENOUS
  Administered 2015-12-11: 250 mg via INTRAVENOUS
  Administered 2015-12-11: 50 mg via INTRAVENOUS

## 2015-12-11 MED ORDER — KETOROLAC TROMETHAMINE 30 MG/ML IJ SOLN
INTRAMUSCULAR | Status: DC | PRN
  Administered 2015-12-11: 30 mg via INTRAVENOUS

## 2015-12-11 MED ORDER — MIDAZOLAM HCL 2 MG/2ML IJ SOLN
INTRAMUSCULAR | Status: AC
  Filled 2015-12-11: qty 2

## 2015-12-11 MED ORDER — STERILE WATER FOR IRRIGATION IR SOLN
Status: DC | PRN
  Administered 2015-12-11: 500 mL

## 2015-12-11 MED ORDER — IOHEXOL 300 MG/ML  SOLN
INTRAMUSCULAR | Status: DC | PRN
  Administered 2015-12-11: 8 mL

## 2015-12-11 MED ORDER — LIDOCAINE HCL 2 % EX GEL
CUTANEOUS | Status: AC
  Filled 2015-12-11: qty 5

## 2015-12-11 SURGICAL SUPPLY — 27 items
BAG DRAIN URO-CYSTO SKYTR STRL (DRAIN) ×3 IMPLANT
BASKET ZERO TIP NITINOL 2.4FR (BASKET) ×3 IMPLANT
BOOTIES KNEE HIGH SLOAN (MISCELLANEOUS) ×3 IMPLANT
CATH ROBINSON RED A/P 8FR (CATHETERS) ×3 IMPLANT
CATH URET DUAL LUMEN 6-10FR 50 (CATHETERS) IMPLANT
CLOTH BEACON ORANGE TIMEOUT ST (SAFETY) ×6 IMPLANT
FIBER LASER LITHO 273 (Laser) ×3 IMPLANT
GLOVE BIO SURGEON STRL SZ 6.5 (GLOVE) ×2 IMPLANT
GLOVE BIO SURGEON STRL SZ7.5 (GLOVE) ×3 IMPLANT
GLOVE BIO SURGEONS STRL SZ 6.5 (GLOVE) ×1
GLOVE INDICATOR 6.5 STRL GRN (GLOVE) ×6 IMPLANT
GOWN STRL REUS W/ TWL LRG LVL3 (GOWN DISPOSABLE) ×1 IMPLANT
GOWN STRL REUS W/ TWL XL LVL3 (GOWN DISPOSABLE) ×1 IMPLANT
GOWN STRL REUS W/TWL LRG LVL3 (GOWN DISPOSABLE) ×2
GOWN STRL REUS W/TWL XL LVL3 (GOWN DISPOSABLE) ×2
GUIDEWIRE STR DUAL SENSOR (WIRE) ×6 IMPLANT
IV NS 1000ML (IV SOLUTION) ×2
IV NS 1000ML BAXH (IV SOLUTION) ×1 IMPLANT
IV NS IRRIG 3000ML ARTHROMATIC (IV SOLUTION) ×3 IMPLANT
KIT ROOM TURNOVER WOR (KITS) ×3 IMPLANT
MANIFOLD NEPTUNE II (INSTRUMENTS) ×3 IMPLANT
PACK CYSTO (CUSTOM PROCEDURE TRAY) ×3 IMPLANT
SHEATH ACCESS URETERAL 38CM (SHEATH) IMPLANT
STENT URET 6FRX26 CONTOUR (STENTS) ×3 IMPLANT
TUBE CONNECTING 12'X1/4 (SUCTIONS)
TUBE CONNECTING 12X1/4 (SUCTIONS) IMPLANT
WATER STERILE IRR 500ML POUR (IV SOLUTION) ×3 IMPLANT

## 2015-12-11 NOTE — Op Note (Signed)
Preoperative Diagnosis:         left renal and ureteral stone    Postoperative Diagnosis:         left renal and ureteral stone  (left renal and ureteral stone (8mm in left lower pole, 8mm in left interpolar, 8mm in left distal ureter)   Procedure(s) Performed:         1. Cystourethroscopy        2. Left ureteroscopy with laser lithotripsy and basket stone extraction       3. Left retrograde pyelogram       4. Left ureteral stent placement (442fr x 26cm JJ with dangler)       5. Intraoperative fluoroscopy with interpretation less than 1 hour   Teaching Surgeon:   Jethro BolusSigmund Tannenbaum, MD    Resident Surgeon:    Christ Kickroy Avner Stroder,M.D.   Assistants:         None.   Anesthesia:         General via endotracheal tube.     Specimens:          Renal stone for chemical analysis.     Drains:             692f x 26 double-J ureteral stent with string   Cultures:           None   Estimated Blood Loss: Minimal   IV Fluids:          See Anesthesia record   Complications:              None.   Indications for Surgery:    Darren Reynolds  is a 40 y.o.-year-old male with a history of nephrolithiasis.  The patient was evaluated and noted with a 8 mm left ureteral stone and multiple left renal stones.  The patient presents today for ureteroscopic treatment of left renal and ureteral stone. The risks and benefits of the procedure were discussed with the patient who wishes to proceed.   Operative Findings:          1) Normal cystourethroscopy 2) 8mm stone seen in left distal ureter which was removed. 3) Two additional 8mm stones seen in left lower pole and interpolar calyces respectively which were removed. 4) Successful placement of left ureteral stent on dangler.  Radiologic interpretation of Retrograde pyelogram:  Left kidney demonstrated mild hydronephrosis with normal appearing ureter with obstructing stone in distal left ureter.  Procedure:          The patient was correctly identified in the  preoperative holding area where written informed consent as well potential risks and complications were reviewed. The patient was brought back to the operative suite where a preinduction timeout was performed. After correct information was verified, general anesthesia was induced via endotracheal tube.  The patient was then gently placed in dorsal lithotomy position.  Sequential compression devices were placed for VTE prophylaxis. The patient was prepped and draped in the usual sterile fashion and appropriate periprocedural antibiotics were administered. A second timeout was performed.   We began by inserting a 22 French rigid cystoscope per urethra with copious lubrication and normal saline irrigation running. The bladder and urethra appeared grossly normal.  We proceeded to cannulate the left ureteral orifice with a combination of a sensor wire and open-ended catheter and performed a retrograde pyelogram after wire removal.  This demonstrated normal caliber of the ureter with a distal ureteral stone and mild hydronephrosis.  We then replaced our wire into the renal pelvis under  fluoroscopic guidance. We advanced a dual lumen rigid ureteroscope alongside the wire into the distal left ureter where we encountered the stone and using a combination of laser lithotripsy and basket extraction removed all fragments. These were sent for stone analysis. We then passed a second sensor wire through the ureteroscope into the left renal collecting system. The rigid ureteroscope was removed.  We then passed a dual channel flexible ureteroscope over one of our wires into the renal collecting system and performed pan pyeloscopy as described above.  Two additional 8mm renal stones were noted as described in the above findings and the patient was noted with no Randall plaque.  Using a combination of laser lithotripsy and basket extraction, all visualized stones were removed and the patient was felt to be stone-free at the end  of the procedure.  We performed a retrograde ureteroscopy showing no injury to the ureter, leaving our sensor wire in place.   At this point, we proceeded with ureteral stent placement.  We placed a 6 x 26 double-J ureteral stent with the assistance of a pusher over our sensor wire with a satisfactory stent curl proximally and distally within the bladder. We then refilled and emptied the patient's bladder to ensure all debris was removed and left the bladder empty and removed all instrumentation. The patient was awoken from anesthesia and taken to the recovery area.   Post-Op Plan/Instructions:         1)Discharge home when meets PACU criteria and voids x 1 2) patient to remove ureteral stent by pulling black strings in 5 days at home or may call clinic and have one of clinic nurses do it as well 3) return to clinic in 6 weeks with renal ultrasound beforehand

## 2015-12-11 NOTE — Anesthesia Procedure Notes (Signed)
Procedure Name: LMA Insertion Date/Time: 12/11/2015 9:51 AM Performed by: Karlyne GreenspanJUDD, BENJAMIN Pre-anesthesia Checklist: Patient identified, Emergency Drugs available, Suction available and Patient being monitored Patient Re-evaluated:Patient Re-evaluated prior to inductionOxygen Delivery Method: Circle system utilized Preoxygenation: Pre-oxygenation with 100% oxygen Intubation Type: IV induction Ventilation: Mask ventilation without difficulty LMA: LMA inserted LMA Size: 5.0 Number of attempts: 1 Airway Equipment and Method: Bite block Placement Confirmation: positive ETCO2 Tube secured with: Tape Dental Injury: Teeth and Oropharynx as per pre-operative assessment

## 2015-12-11 NOTE — Anesthesia Postprocedure Evaluation (Signed)
Anesthesia Post Note  Patient: Darren Reynolds  Procedure(s) Performed: Procedure(s) (LRB): CYSTOSCOPY/URETEROSCOPY/HOLMIUM LASER/STONE BASKETRY/ STENT PLACEMENT (Left)  Patient location during evaluation: PACU Anesthesia Type: General Level of consciousness: awake and alert Pain management: pain level controlled Vital Signs Assessment: post-procedure vital signs reviewed and stable Respiratory status: spontaneous breathing, nonlabored ventilation, respiratory function stable and patient connected to nasal cannula oxygen Cardiovascular status: blood pressure returned to baseline and stable Postop Assessment: no signs of nausea or vomiting Anesthetic complications: no    Last Vitals:  Vitals:   12/11/15 1145 12/11/15 1200  BP: (!) 108/59 98/67  Pulse: 74 72  Resp: 16 14  Temp:      Last Pain:  Vitals:   12/11/15 1131  TempSrc:   PainSc: 0-No pain                 [name], [name]

## 2015-12-11 NOTE — Anesthesia Preprocedure Evaluation (Signed)
Anesthesia Evaluation  Patient identified by MRN, date of birth, ID band Patient awake    Reviewed: Allergy & Precautions, H&P , NPO status , Patient's Chart, lab work & pertinent test results  History of Anesthesia Complications Negative for: history of anesthetic complications  Airway Mallampati: II  TM Distance: >3 FB Neck ROM: full    Dental no notable dental hx.    Pulmonary neg pulmonary ROS,    Pulmonary exam normal breath sounds clear to auscultation       Cardiovascular negative cardio ROS Normal cardiovascular exam Rhythm:regular Rate:Normal     Neuro/Psych negative neurological ROS     GI/Hepatic negative GI ROS, Neg liver ROS,   Endo/Other  negative endocrine ROS  Renal/GU Renal disease (stones)     Musculoskeletal   Abdominal   Peds  Hematology negative hematology ROS (+)   Anesthesia Other Findings   Reproductive/Obstetrics negative OB ROS                             Anesthesia Physical Anesthesia Plan  ASA: II  Anesthesia Plan: General   Post-op Pain Management:    Induction: Intravenous  Airway Management Planned: LMA  Additional Equipment:   Intra-op Plan:   Post-operative Plan:   Informed Consent: I have reviewed the patients History and Physical, chart, labs and discussed the procedure including the risks, benefits and alternatives for the proposed anesthesia with the patient or authorized representative who has indicated his/her understanding and acceptance.   Dental Advisory Given  Plan Discussed with: Anesthesiologist, CRNA and Surgeon  Anesthesia Plan Comments:         Anesthesia Quick Evaluation

## 2015-12-11 NOTE — Interval H&P Note (Signed)
History and Physical Interval Note:  12/11/2015 9:43 AM  Darren Reynolds  has presented today for surgery, with the diagnosis of LEFT URETERAL STONE  The various methods of treatment have been discussed with the patient and family. After consideration of risks, benefits and other options for treatment, the patient has consented to  Procedure(s): CYSTOSCOPY/URETEROSCOPY/HOLMIUM LASER (Left) as a surgical intervention .  The patient's history has been reviewed, patient examined, no change in status, stable for surgery.  I have reviewed the patient's chart and labs.  Questions were answered to the patient's satisfaction.     Beyonca Wisz I Ainara Eldridge

## 2015-12-11 NOTE — Discharge Instructions (Signed)
Post Anesthesia Home Care Instructions  Activity: Get plenty of rest for the remainder of the day. A responsible adult should stay with you for 24 hours following the procedure.  For the next 24 hours, DO NOT: -Drive a car -Advertising copywriterperate machinery -Drink alcoholic beverages -Take any medication unless instructed by your physician -Make any legal decisions or sign important papers.  Meals: Start with liquid foods such as gelatin or soup. Progress to regular foods as tolerated. Avoid greasy, spicy, heavy foods. If nausea and/or vomiting occur, drink only clear liquids until the nausea and/or vomiting subsides. Call your physician if vomiting continues.  Special Instructions/Symptoms: Your throat may feel dry or sore from the anesthesia or the breathing tube placed in your throat during surgery. If this causes discomfort, gargle with warm salt water. The discomfort should disappear within 24 hours.  If you had a scopolamine patch placed behind your ear for the management of post- operative nausea and/or vomiting:  1. The medication in the patch is effective for 72 hours, after which it should be removed.  Wrap patch in a tissue and discard in the trash. Wash hands thoroughly with soap and water. 2. You may remove the patch earlier than 72 hours if you experience unpleasant side effects which may include dry mouth, dizziness or visual disturbances. 3. Avoid touching the patch. Wash your hands with soap and water after contact with the patch.   You have a ureteral stent in place. There is a small string attached to this that is coming out of the tube that you urinate out of. You will pull the string with the stent out of your urethra in 5 days. Do not pull out the string before 5 days. Do this first thing in the morning. It should not hurt badly but may feel strange. You may see blood in your urine afterwards and that is normal as long as it doesn't look thick like ketchup, have large blood clots or  prevent you from urinating.   Contact [facility] at the numbers below for fever >101.67F by mouth, uncontrolled nausea or vomiting, pain uncontrolled by medication, decreased urine output, signs of wound infection (rapidly spreading redness/swelling, purulent discharge, increasing bleeding from wounds, or separation of wound); also call for any new or concerning symptoms.  You may shower and bath as normal  Do not drive while taking narcotic pain medications. Take Colace and/or Senna while taking narcotic pain medication. You may take over-the-counter Laxatives such as Miralax, Senna, or Milk of Magnesia. Stop taking these medications if you develop diarrhea.  Take all medications as prescribed.  Ureteral Stent Instructions: You have been discharged home with a ureteral stent in place. This is a temporary tube placed in the tube (aka ureter) that drains from the kidney to the bladder. This stent is TEMPORARY and it must be removed or exchanged within three months or else significant damage can occur.  Stents cause most patients to have side effects which may include urinary urgency, frequency, and/or pain with voiding. In a male, they can cause erection problems. They may also cause discomfort with strenuous activity. You will likely receive several medicines to help with these symptoms, for example, oxybutynin (ditropan) or tamsulosin (flomax). Oxybutynin helps quiet the bladder, but it may cause constipation or dry mouth. Please use over-the-counter laxatives or stool softeners to help with the former, and sucking on a sugar-free lozenge can help with dry mouth. Tamsulosin is usually taken at night before going to bed, and it  helps with stent discomfort. You may also be prescribed a urinary analgesic (e.g. pyridium, prosed, uribel) to help with pain with urination that can change the color of your urine. Please do not be alarmed by this color change.  You may see some blood in your urine, but  this is usually normal. If you have persistent bleeding that is not improving and/or large amounts of clots in your urine and the inability to urinate, please contact [facility].

## 2015-12-11 NOTE — H&P (Signed)
Office Visit Report     12/06/2015   --------------------------------------------------------------------------------   Quita SkyeMitchell S. Dareen Pianonderson  MRN: [id number]  PRIMARY CARE:  Lucky CowboyWilliam McKeown, MD  DOB: 06-23-1975, 40 year old Male  REFERRING:    [id number]  PROVIDER:  Jethro BolusSigmund Nalee Lightle, M.D.    TREATING:  Lincoln Brighamroy Sukhu    LOCATION:  [facility] [phone]   --------------------------------------------------------------------------------   CC: I have kidney stones.  HPI: Darren Reynolds is a 40 year-old male patient who is here for renal calculi.  The problem is on the left side. This is not his first kidney stone. He has had 1 stones prior to getting this one. He is currently having flank pain. He denies having back pain, groin pain, nausea, vomiting, fever, and chills. He has not caught a stone in his urine strainer since his symptoms began.   He has had eswl for treatment of his stones in the past.   This is a 40 year old male with a history of nephrolithiasis. He is seen both Dr. Mosetta PuttGrady and Dr. Venetia Constableenenbaum in the past. He was last seen in February 2012 after presenting with right flank pain to [facility]. CT scan revealed a 5 x 7 mm Rt mid ureteral stone. The patient underwent right sided shockwave lithotripsy at that time. He more recently presented to the emergency room on 12/02/15 with left flank pain and hematuria for about 4 days. A CT scan demonstrated an 8 mm proximal left ureteral stone in addition to another 8 mm nonobstructing left renal stone and smaller left renal stones. He was having nausea but no vomiting that time and his pain was fairly well controlled. He was discharged home on medical expulsive therapy with follow-up today. He denies any dysuria, fevers, chills, nausea/vomiting currently.     ALLERGIES: No Allergies    MEDICATIONS: Citalopram Hydrobromide TABS Oral  Multiple Vitamin TABS Oral  Oxycodone-Acetaminophen 5-325 MG Oral  Tablet Oral  Vitamin D CAPS Oral  Zofran 8 MG Oral Tablet Oral     GU PSH: Hernia Repair - 2012 Renal ESWL - 2012      PSH Notes: Lithotripsy, Hernia Repair   NON-GU PSH: None   GU PMH: Calculus Ureter, Calculus of right ureter - 2014 Personal Hx urinary calculi, Nephrolithiasis - 2014      PMH Notes:  1898-04-29 00:00:00 - Note: Normal Routine History And Physical Adult   NON-GU PMH: None   FAMILY HISTORY: Family Health Status - Father's Age - Runs In Family Family Health Status - Mother's Age - Runs In Family Family Health Status Number - Runs In Family Hematuria - Mother nephrolithiasis - Mother Renal Cell Carcinoma - Mother   SOCIAL HISTORY: None    Notes: Alcohol Use, Occupation:, Marital History - Currently Married, Tobacco Use, Caffeine Use   REVIEW OF SYSTEMS:    GU Review Male:   Patient denies frequent urination, hard to postpone urination, burning/ pain with urination, get up at night to urinate, leakage of urine, stream starts and stops, trouble starting your stream, have to strain to urinate , erection problems, and penile pain.  Gastrointestinal (Upper):   Patient denies nausea, vomiting, and indigestion/ heartburn.  Gastrointestinal (Lower):   Patient denies diarrhea and constipation.  Constitutional:   Patient denies fever, night sweats, weight loss, and fatigue.  Skin:   Patient denies skin rash/ lesion and itching.  Eyes:   Patient denies blurred vision and double vision.  Ears/ Nose/ Throat:   Patient  denies sore throat and sinus problems.  Hematologic/Lymphatic:   Patient denies swollen glands and easy bruising.  Cardiovascular:   Patient denies leg swelling and chest pains.  Respiratory:   Patient denies cough and shortness of breath.  Endocrine:   Patient denies excessive thirst.  Musculoskeletal:   Patient denies back pain and joint pain.  Neurological:   Patient denies headaches and dizziness.  Psychologic:   Patient denies depression and anxiety.    VITAL SIGNS:      12/06/2015 03:05 PM  Weight 252 lb / 114.31 kg  Height 70 in / 177.8 cm  BP 125/86 mmHg  Pulse 78 /min  Temperature 97.8 F / 37 C  BMI 36.2 kg/m   MULTI-SYSTEM PHYSICAL EXAMINATION:    Constitutional: Well-nourished. No physical deformities. Normally developed. Good grooming.  Neck: Neck symmetrical, not swollen. Normal tracheal position.  Respiratory: No labored breathing, no use of accessory muscles.   Cardiovascular: Normal temperature, normal extremity pulses, no swelling, no varicosities.  Skin: No paleness, no jaundice, no cyanosis. No lesion, no ulcer, no rash.  Neurologic / Psychiatric: Oriented to time, oriented to place, oriented to person. No depression, no anxiety, no agitation.  Gastrointestinal: No mass, no tenderness, no rigidity, non obese abdomen. no left CVA tenderness.  Eyes: Normal conjunctivae. Normal eyelids.  Ears, Nose, Mouth, and Throat: Left ear no scars, no lesions, no masses. Right ear no scars, no lesions, no masses. Nose no scars, no lesions, no masses. Normal hearing. Normal lips.     PAST DATA REVIEWED:  Source Of History:  Patient  Records Review:   Previous Patient Records  Urine Test Review:   Urinalysis  X-Ray Review: C.T. Abdomen/Pelvis: Reviewed Films. Reviewed Report. Discussed With Patient. There is evidence of an 8mm proximal left ureteral stone with mild left hydronephrosis. In addition, there is a 8 mm nonobstructing left renal stone and a few smaller stones.    PROCEDURES:          Urinalysis w/Scope - 81001 Dipstick Dipstick Cont'd Micro  Specimen: Voided Bilirubin: Neg WBC/hpf: NS (Not Seen)  Color: Yellow Ketones: Neg RBC/hpf: 3-10/hpf  Appearance: Clear Blood: 2+ Bacteria: NS (Not Seen)  Specific Gravity: 1.025 Protein: Neg Cystals: NS (Not Seen)  pH: 5.5 Urobilinogen: 0.2 Casts: NS (Not Seen)  Glucose: Neg Nitrites: Neg Trichomonas: Not Present    Leukocyte Esterase: Neg Mucous: Not Present      Epithelial  Cells: 0-5/hpf      Yeast: NS (Not Seen)      Sperm: Not Present    ASSESSMENT:      ICD-10 Details  1 GU:   Calculus Ureter - N20.1           Notes:   40 year old otherwise healthy male with a millimeter proximal left ureteral stone and an additional 1 cm left nonobstructing renal stone burden. We discussed the management options of his ureteral stone including medical. Therapy, ureteroscopy with lithotripsy, shockwave lithotripsy, and percutaneous nephrolithotomy. Given the size and locations of the stone, I felt that ureteroscopy or shockwave lithotripsy would be the best options. He does not wish to pursue medical expulsive therapy any further. I discussed the risks and benefits of both shockwave lithotripsy and ureteroscopy. He is interested in trying to treat as much of his left-sided stone burden as possible and thus favors ureteroscopy, which is very reasonable.    PLAN:            Medications New Meds: Vicodin 5 mg-300 mg tablet 1  tablet PO Q 6 H PRN   #30  0 Refill(s)            Orders Labs Urine Culture and Sensitivity, Urinalysis          [facility]: [facility] - Cysto Uretero Lithotripsy - [id number], left          Document Letter(s):  Created for Patient: Clinical Summary         Notes:   - we will schedule him for a cystoscopy, left ureteroscopy with lithotripsy, left ureteral stent placement at the next available date  -A prescription for Vicodin 5 mg/300 mg was provided in clinic today with 30 tablets  -We will send a urinalysis and urine culture today to ensure that he is no evidence of urinary tract infection, although there is low suspicion for this.   Dr. Jethro BolusSigmund Jermaine Tholl was the supervising physician for this visit.   Signed by Lincoln Brighamroy Sukhu on 12/06/15 at 4:22 PM (EDT)     The information contained in this medical record document is considered private and confidential patient information. This information can  only be used for the medical diagnosis and/or medical services that are being provided by the patient's selected caregivers. This information can only be distributed outside of the patient's care if the patient agrees and signs waivers of authorization for this information to be sent to an outside source or route.

## 2015-12-11 NOTE — Transfer of Care (Signed)
Last Vitals:  Vitals:   12/11/15 0745  BP: (!) 142/86  Pulse: 67  Resp: 18  Temp: 36.4 C    Last Pain:  Vitals:   12/11/15 0745  TempSrc: Oral      Patients Stated Pain Goal: 8 (12/11/15 [id number]) Immediate Anesthesia Transfer of Care Note  Patient: Darren Reynolds  Procedure(s) Performed: Procedure(s) (LRB): CYSTOSCOPY/URETEROSCOPY/HOLMIUM LASER/STONE BASKETRY/ STENT PLACEMENT (Left)  Patient Location: PACU  Anesthesia Type: General  Level of Consciousness:drowsy  Airway & Oxygen Therapy: Patient Spontanous Breathing and Patient connected to nasal cannula oxygen  Post-op Assessment: Report given to PACU RN and Post -op Vital signs reviewed and stable  Post vital signs: Reviewed and stable  Complications: No apparent anesthesia complications

## 2015-12-12 ENCOUNTER — Encounter (HOSPITAL_BASED_OUTPATIENT_CLINIC_OR_DEPARTMENT_OTHER): Payer: Self-pay | Admitting: Urology

## 2016-04-03 ENCOUNTER — Ambulatory Visit (INDEPENDENT_AMBULATORY_CARE_PROVIDER_SITE_OTHER): Payer: Self-pay | Admitting: Internal Medicine

## 2016-04-03 ENCOUNTER — Encounter: Payer: Self-pay | Admitting: Internal Medicine

## 2016-04-03 VITALS — BP 130/80 | HR 92 | Temp 98.2°F | Resp 16 | Ht 70.5 in | Wt 243.0 lb

## 2016-04-03 DIAGNOSIS — F321 Major depressive disorder, single episode, moderate: Secondary | ICD-10-CM

## 2016-04-03 MED ORDER — TRAZODONE HCL 50 MG PO TABS: 50.0000 mg | ORAL_TABLET | Freq: Every day | ORAL | 2 refills | Status: AC

## 2016-04-03 MED ORDER — SERTRALINE HCL 50 MG PO TABS: 50.0000 mg | ORAL_TABLET | Freq: Every day | ORAL | 2 refills | Status: AC

## 2016-04-03 NOTE — Patient Instructions (Signed)
Sertraline tablets What is this medicine? SERTRALINE (SER tra leen) is used to treat depression. It may also be used to treat obsessive compulsive disorder, panic disorder, post-trauma stress, premenstrual dysphoric disorder (PMDD) or social anxiety. This medicine may be used for other purposes; ask your health care provider or pharmacist if you have questions. COMMON BRAND NAME(S): Zoloft What should I tell my health care provider before I take this medicine? They need to know if you have any of these conditions: -bleeding disorders -bipolar disorder or a family history of bipolar disorder -glaucoma -heart disease -high blood pressure -history of irregular heartbeat -history of low levels of calcium, magnesium, or potassium in the blood -if you often drink alcohol -liver disease -receiving electroconvulsive therapy -seizures -suicidal thoughts, plans, or attempt; a previous suicide attempt by you or a family member -take medicines that treat or prevent blood clots -thyroid disease -an unusual or allergic reaction to sertraline, other medicines, foods, dyes, or preservatives -pregnant or trying to get pregnant -breast-feeding How should I use this medicine? Take this medicine by mouth with a glass of water. Follow the directions on the prescription label. You can take it with or without food. Take your medicine at regular intervals. Do not take your medicine more often than directed. Do not stop taking this medicine suddenly except upon the advice of your doctor. Stopping this medicine too quickly may cause serious side effects or your condition may worsen. A special MedGuide will be given to you by the pharmacist with each prescription and refill. Be sure to read this information carefully each time. Talk to your pediatrician regarding the use of this medicine in children. While this drug may be prescribed for children as young as 7 years for selected conditions, precautions do  apply. Overdosage: If you think you have taken too much of this medicine contact a poison control center or emergency room at once. NOTE: This medicine is only for you. Do not share this medicine with others. What if I miss a dose? If you miss a dose, take it as soon as you can. If it is almost time for your next dose, take only that dose. Do not take double or extra doses. What may interact with this medicine? Do not take this medicine with any of the following medications: -certain medicines for fungal infections like fluconazole, itraconazole, ketoconazole, posaconazole, voriconazole -cisapride -disulfiram -dofetilide -linezolid -MAOIs like Carbex, Eldepryl, Marplan, Nardil, and Parnate -metronidazole -methylene blue (injected into a vein) -pimozide -thioridazine -ziprasidone This medicine may also interact with the following medications: -alcohol -amphetamines -aspirin and aspirin-like medicines -certain medicines for depression, anxiety, or psychotic disturbances -certain medicines for irregular heart beat like flecainide, propafenone -certain medicines for migraine headaches like almotriptan, eletriptan, frovatriptan, naratriptan, rizatriptan, sumatriptan, zolmitriptan -certain medicines for sleep -certain medicines for seizures like carbamazepine, valproic acid, phenytoin -certain medicines that treat or prevent blood clots like warfarin, enoxaparin, dalteparin -cimetidine -digoxin -diuretics -fentanyl -furazolidone -isoniazid -lithium -NSAIDs, medicines for pain and inflammation, like ibuprofen or naproxen -other medicines that prolong the QT interval (cause an abnormal heart rhythm) -procarbazine -rasagiline -supplements like St. John's wort, kava kava, valerian -tolbutamide -tramadol -tryptophan This list may not describe all possible interactions. Give your health care provider a list of all the medicines, herbs, non-prescription drugs, or dietary supplements you  use. Also tell them if you smoke, drink alcohol, or use illegal drugs. Some items may interact with your medicine. What should I watch for while using this medicine? Tell your doctor if  your symptoms do not get better or if they get worse. Visit your doctor or health care professional for regular checks on your progress. Because it may take several weeks to see the full effects of this medicine, it is important to continue your treatment as prescribed by your doctor. Patients and their families should watch out for new or worsening thoughts of suicide or depression. Also watch out for sudden changes in feelings such as feeling anxious, agitated, panicky, irritable, hostile, aggressive, impulsive, severely restless, overly excited and hyperactive, or not being able to sleep. If this happens, especially at the beginning of treatment or after a change in dose, call your health care professional. Bonita QuinYou may get drowsy or dizzy. Do not drive, use machinery, or do anything that needs mental alertness until you know how this medicine affects you. Do not stand or sit up quickly, especially if you are an older patient. This reduces the risk of dizzy or fainting spells. Alcohol may interfere with the effect of this medicine. Avoid alcoholic drinks. Your mouth may get dry. Chewing sugarless gum or sucking hard candy, and drinking plenty of water may help. Contact your doctor if the problem does not go away or is severe. What side effects may I notice from receiving this medicine? Side effects that you should report to your doctor or health care professional as soon as possible: -allergic reactions like skin rash, itching or hives, swelling of the face, lips, or tongue -anxious -black, tarry stools -changes in vision -confusion -elevated mood, decreased need for sleep, racing thoughts, impulsive behavior -eye pain -fast, irregular heartbeat -feeling faint or lightheaded, falls -feeling agitated, angry, or  irritable -hallucination, loss of contact with reality -loss of balance or coordination -loss of memory -painful or prolonged erections -restlessness, pacing, inability to keep still -seizures -stiff muscles -suicidal thoughts or other mood changes -trouble sleeping -unusual bleeding or bruising -unusually weak or tired -vomiting Side effects that usually do not require medical attention (report to your doctor or health care professional if they continue or are bothersome): -change in appetite or weight -change in sex drive or performance -diarrhea -increased sweating -indigestion, nausea -tremors This list may not describe all possible side effects. Call your doctor for medical advice about side effects. You may report side effects to FDA at 1-800-FDA-1088. Where should I keep my medicine? Keep out of the reach of children. Store at room temperature between 15 and 30 degrees C (59 and 86 degrees F). Throw away any unused medicine after the expiration date. NOTE: This sheet is a summary. It may not cover all possible information. If you have questions about this medicine, talk to your doctor, pharmacist, or health care provider.  2017 [employer]vier/Gold Standard (2015-09-16 15:54:02)  Trazodone tablets What is this medicine? TRAZODONE (TRAZ oh done) is used to treat depression. This medicine may be used for other purposes; ask your health care provider or pharmacist if you have questions. COMMON BRAND NAME(S): Desyrel What should I tell my health care provider before I take this medicine? They need to know if you have any of these conditions: -attempted suicide or thinking about it -bipolar disorder -bleeding problems -glaucoma -heart disease, or previous heart attack -irregular heart beat -kidney or liver disease -low levels of sodium in the blood -an unusual or allergic reaction to trazodone, other medicines, foods, dyes or preservatives -pregnant or trying to get  pregnant -breast-feeding How should I use this medicine? Take this medicine by mouth with a glass of water. Follow  the directions on the prescription label. Take this medicine shortly after a meal or a light snack. Take your medicine at regular intervals. Do not take your medicine more often than directed. Do not stop taking this medicine suddenly except upon the advice of your doctor. Stopping this medicine too quickly may cause serious side effects or your condition may worsen. A special MedGuide will be given to you by the pharmacist with each prescription and refill. Be sure to read this information carefully each time. Talk to your pediatrician regarding the use of this medicine in children. Special care may be needed. Overdosage: If you think you have taken too much of this medicine contact a poison control center or emergency room at once. NOTE: This medicine is only for you. Do not share this medicine with others. What if I miss a dose? If you miss a dose, take it as soon as you can. If it is almost time for your next dose, take only that dose. Do not take double or extra doses. What may interact with this medicine? Do not take this medicine with any of the following medications: -certain medicines for fungal infections like fluconazole, itraconazole, ketoconazole, posaconazole, voriconazole -cisapride -dofetilide -dronedarone -linezolid -MAOIs like Carbex, Eldepryl, Marplan, Nardil, and Parnate -mesoridazine -methylene blue (injected into a vein) -pimozide -saquinavir -thioridazine -ziprasidone This medicine may also interact with the following medications: -alcohol -antiviral medicines for HIV or AIDS -aspirin and aspirin-like medicines -barbiturates like phenobarbital -certain medicines for blood pressure, heart disease, irregular heart beat -certain medicines for depression, anxiety, or psychotic disturbances -certain medicines for migraine headache like almotriptan,  eletriptan, frovatriptan, naratriptan, rizatriptan, sumatriptan, zolmitriptan -certain medicines for seizures like carbamazepine and phenytoin -certain medicines for sleep -certain medicines that treat or prevent blood clots like dalteparin, enoxaparin, warfarin -digoxin -fentanyl -lithium -NSAIDS, medicines for pain and inflammation, like ibuprofen or naproxen -other medicines that prolong the QT interval (cause an abnormal heart rhythm) -rasagiline -supplements like St. John's wort, kava kava, valerian -tramadol -tryptophan This list may not describe all possible interactions. Give your health care provider a list of all the medicines, herbs, non-prescription drugs, or dietary supplements you use. Also tell them if you smoke, drink alcohol, or use illegal drugs. Some items may interact with your medicine. What should I watch for while using this medicine? Tell your doctor if your symptoms do not get better or if they get worse. Visit your doctor or health care professional for regular checks on your progress. Because it may take several weeks to see the full effects of this medicine, it is important to continue your treatment as prescribed by your doctor. Patients and their families should watch out for new or worsening thoughts of suicide or depression. Also watch out for sudden changes in feelings such as feeling anxious, agitated, panicky, irritable, hostile, aggressive, impulsive, severely restless, overly excited and hyperactive, or not being able to sleep. If this happens, especially at the beginning of treatment or after a change in dose, call your health care professional. Bonita QuinYou may get drowsy or dizzy. Do not drive, use machinery, or do anything that needs mental alertness until you know how this medicine affects you. Do not stand or sit up quickly, especially if you are an older patient. This reduces the risk of dizzy or fainting spells. Alcohol may interfere with the effect of this  medicine. Avoid alcoholic drinks. This medicine may cause dry eyes and blurred vision. If you wear contact lenses you may feel some discomfort. Lubricating  drops may help. See your eye doctor if the problem does not go away or is severe. Your mouth may get dry. Chewing sugarless gum, sucking hard candy and drinking plenty of water may help. Contact your doctor if the problem does not go away or is severe. What side effects may I notice from receiving this medicine? Side effects that you should report to your doctor or health care professional as soon as possible: -allergic reactions like skin rash, itching or hives, swelling of the face, lips, or tongue -elevated mood, decreased need for sleep, racing thoughts, impulsive behavior -confusion -fast, irregular heartbeat -feeling faint or lightheaded, falls -feeling agitated, angry, or irritable -loss of balance or coordination -painful or prolonged erections -restlessness, pacing, inability to keep still -suicidal thoughts or other mood changes -tremors -trouble sleeping -seizures -unusual bleeding or bruising Side effects that usually do not require medical attention (report to your doctor or health care professional if they continue or are bothersome): -change in sex drive or performance -change in appetite or weight -constipation -headache -muscle aches or pains -nausea This list may not describe all possible side effects. Call your doctor for medical advice about side effects. You may report side effects to FDA at 1-800-FDA-1088. Where should I keep my medicine? Keep out of the reach of children. Store at room temperature between 15 and 30 degrees C (59 to 86 degrees F). Protect from light. Keep container tightly closed. Throw away any unused medicine after the expiration date. NOTE: This sheet is a summary. It may not cover all possible information. If you have questions about this medicine, talk to your doctor, pharmacist, or health  care provider.  2017 Elsevier/Gold Standard (2015-09-14 16:57:05)

## 2016-04-03 NOTE — Progress Notes (Signed)
Subjective:    Patient ID: Darren Reynolds, male    DOB: 12-23-1975, 40 y.o.   MRN: [id number]  HPI  Patient presents to the office for evaluation of anxiety.  He reports that it got a lot worse after he had a procedure a few months ago to remove a kidney stone.  He reports that he has had issues with depression and anxiety in the past.  He was taking celexa 40 mg but that made him really swimmy headed.  He reports that he has been having some issues with sleeping and has not been able to sleep more than 45 minutes at a time.  He reports that he is losing his appetite.  He reports that he does not want to eat.  Then he will get dizzy and light headed.  He has no energy and is very tired and fatigued.  He reports that he feels like he is constantly worried and can't turn his mind off.  He recently lost his job.  He was on 5 mg of citalopram for a long time and he can't really remember whether that has helped.  He reports that he was taking a 1/2 tablet of the celexa and he felt like that was too much.  He reports that he felt foggy.  He reports that he really only took the medication for 2 weeks.     Review of Systems  Constitutional: Positive for appetite change and fatigue. Negative for chills and unexpected weight change.  Respiratory: Negative for chest tightness and shortness of breath.   Cardiovascular: Negative for chest pain and palpitations.  Psychiatric/Behavioral: Positive for decreased concentration, dysphoric mood and sleep disturbance. Negative for self-injury and suicidal ideas. The patient is nervous/anxious.        Objective:   Physical Exam  Constitutional: He is oriented to person, place, and time. He appears well-developed and well-nourished. No distress.  HENT:  Head: Normocephalic.  Mouth/Throat: Oropharynx is clear and moist. No oropharyngeal exudate.  Eyes: Conjunctivae are normal. No scleral icterus.  Neck: Normal range of motion. Neck supple. No JVD present. No  thyromegaly present.  Cardiovascular: Normal rate, regular rhythm, normal heart sounds and intact distal pulses.  Exam reveals no gallop and no friction rub.   No murmur heard. Pulmonary/Chest: Effort normal and breath sounds normal. No respiratory distress. He has no wheezes. He has no rales. He exhibits no tenderness.  Abdominal: Soft. Bowel sounds are normal. He exhibits no distension and no mass. There is no tenderness. There is no rebound and no guarding.  Musculoskeletal: Normal range of motion.  Lymphadenopathy:    He has no cervical adenopathy.  Neurological: He is alert and oriented to person, place, and time. No cranial nerve deficit. Coordination normal.  Skin: Skin is warm and dry. No rash noted. He is not diaphoretic.  Psychiatric: He has a normal mood and affect. His behavior is normal. Judgment and thought content normal.  Nursing note and vitals reviewed.   Vitals:   04/03/16 0903  BP: 130/80  Pulse: 92  Resp: 16  Temp: 98.2 F (36.8 C)         Assessment & Plan:    1. Moderate single current episode of major depressive disorder (HCC) -start a more regulated schedule -zoloft 25 mg x 3 weeks, possibly increase to 50 mg at that time if no side effects -trazodone at bedtime for sleep -currently without insurance recommended that when he gets insurance that we run full labs at that  time to avoid financial stress.

## 2016-05-01 ENCOUNTER — Ambulatory Visit: Payer: Self-pay | Admitting: Internal Medicine

## 2017-05-20 IMAGING — CT CT RENAL STONE PROTOCOL
2 of 4 series · 15 of 46 positions shown, 17 images · non-contrast
Comparison: CT the abdomen and pelvis 06/02/2010.

CLINICAL DATA: 40-year-old male with history of left-sided flank
pain for the past 4 days and hematuria today. Prior history of
kidney stones status post lithotripsy.

EXAM:
CT ABDOMEN AND PELVIS WITHOUT CONTRAST
TECHNIQUE: Multidetector CT imaging of the abdomen and pelvis was performed
following the standard protocol without IV contrast.

[Series 2: axial st · axial · 0.98mm/px · z∈[-623,-173]mm · 12 of 100 slices shown, 14 images]
[im 5/100  soft-tissue]
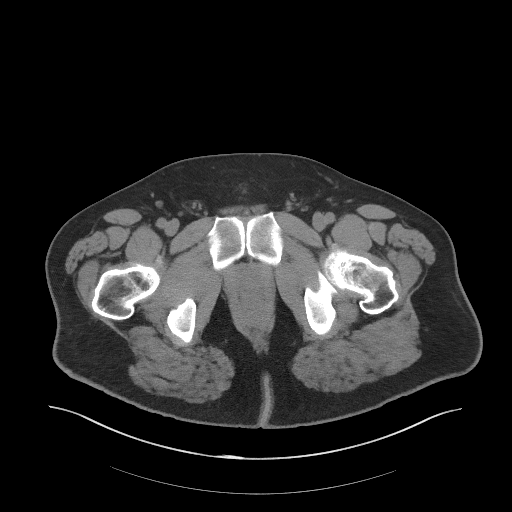
[im 5/100  bone]
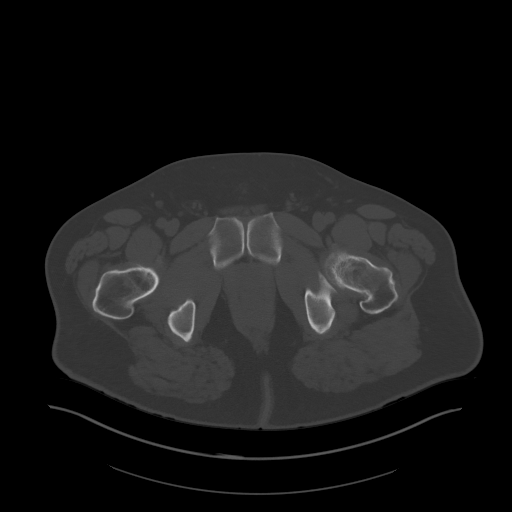
[im 13/100  soft-tissue]
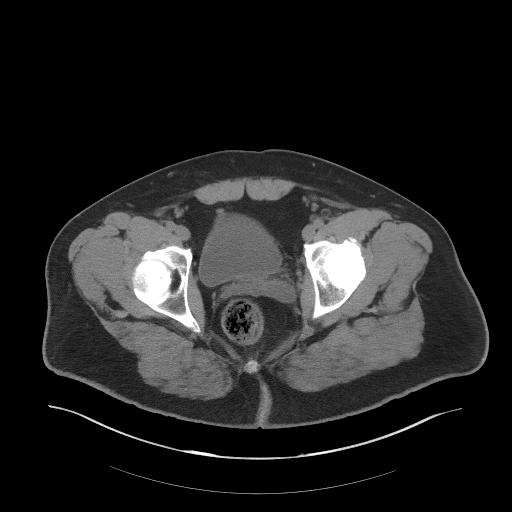
[im 22/100  soft-tissue]
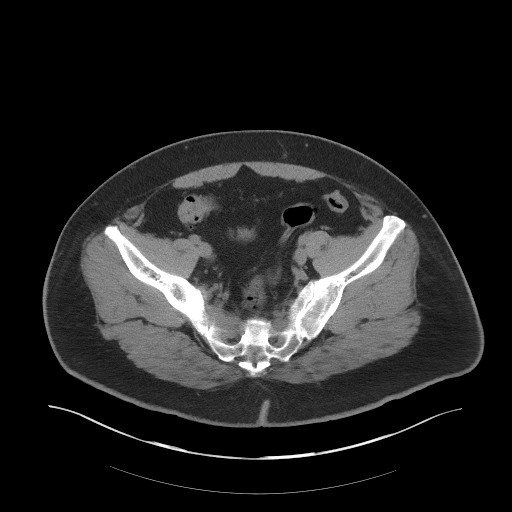
[im 31/100  soft-tissue]
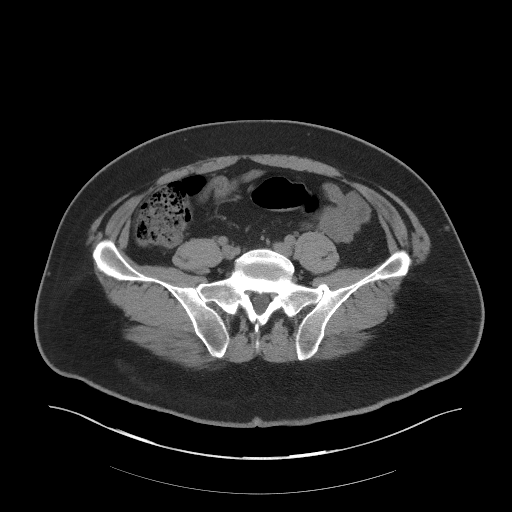
[im 39/100  soft-tissue]
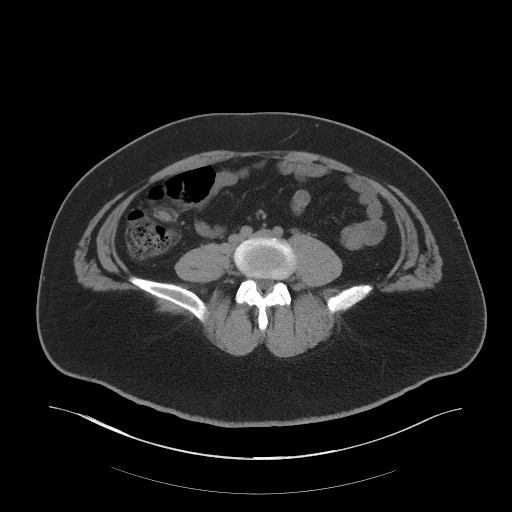
[im 48/100  soft-tissue]
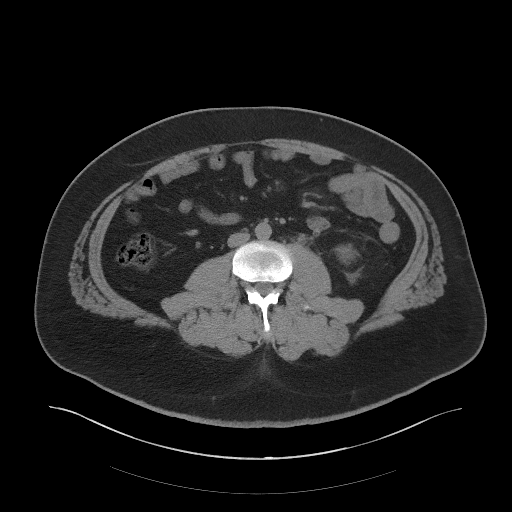
[im 52/100  soft-tissue]
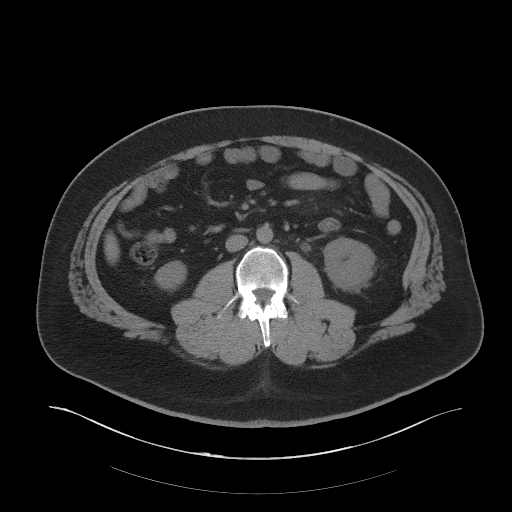
[im 61/100  soft-tissue]
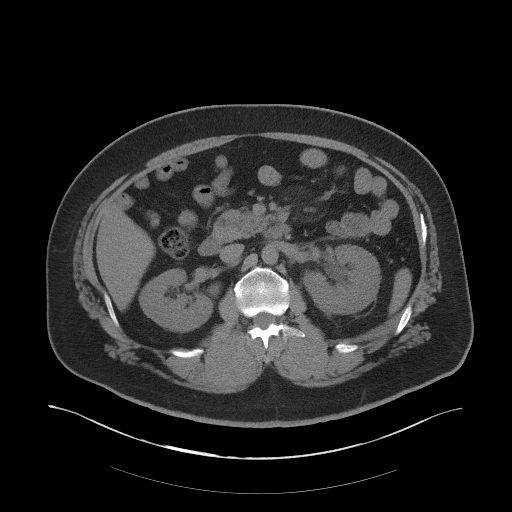
[im 69/100  soft-tissue]
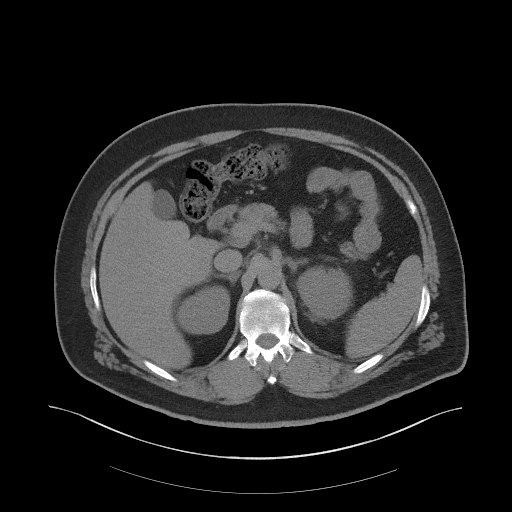
[im 69/100  bone]
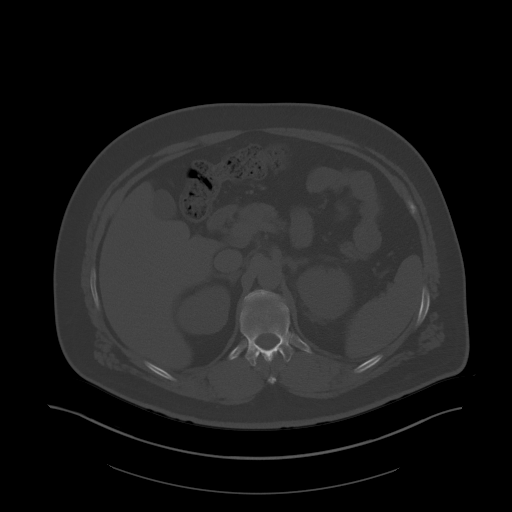
[im 78/100  soft-tissue]
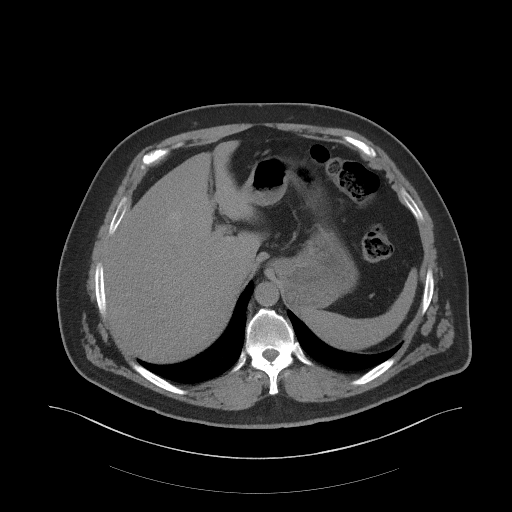
[im 87/100  soft-tissue]
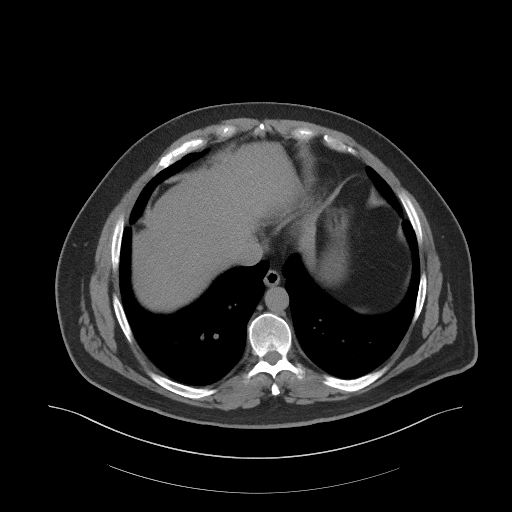
[im 95/100  soft-tissue]
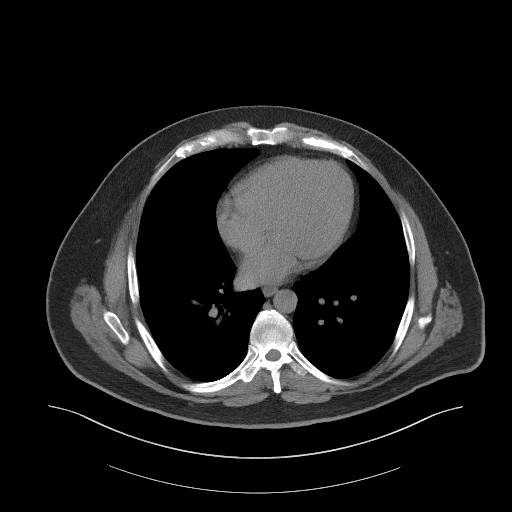

[Series 5: coronal st · coronal · 0.89mm/px · 3 of 105 slices shown]
[im 35/105  soft-tissue]
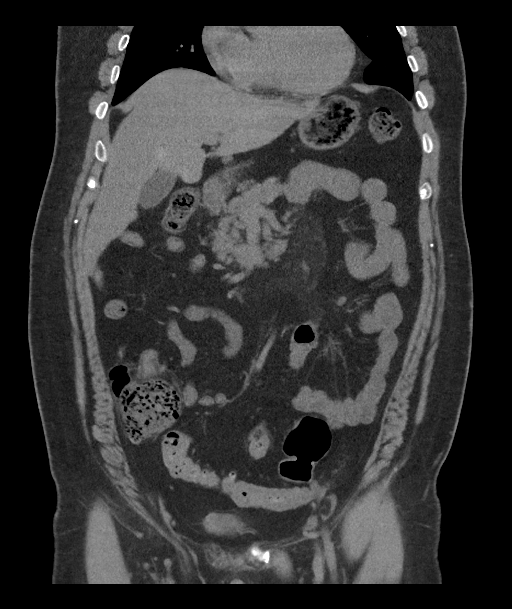
[im 47/105  soft-tissue]
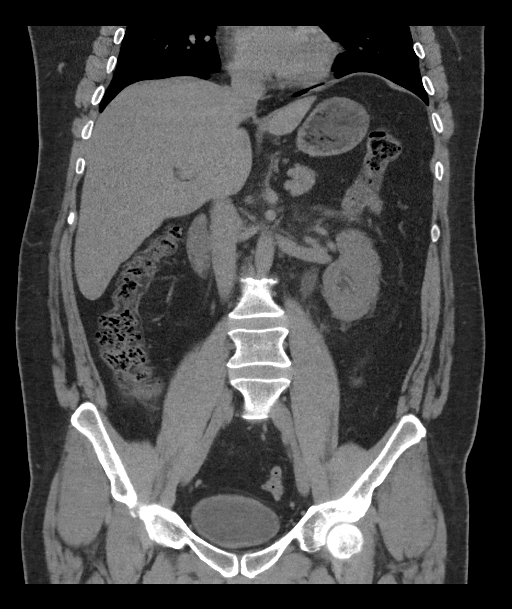
[im 58/105  soft-tissue]
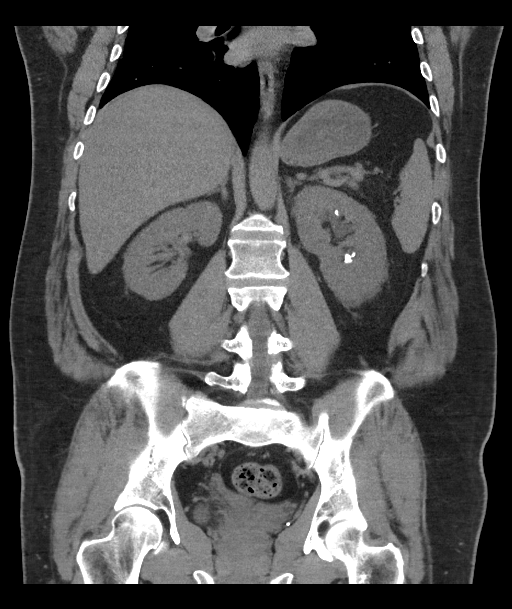

[15 of 46 positions shown; findings below may reference images not displayed]

FINDINGS: Lower chest:  Unremarkable.

Hepatobiliary: Diffuse low attenuation throughout the hepatic
parenchyma, compatible with hepatic steatosis. No definite cystic or
solid hepatic lesions on today's noncontrast CT examination.
Unenhanced appearance of the gallbladder is normal.

Pancreas: No definite pancreatic mass or peripancreatic inflammatory
changes on today's noncontrast CT examination.

Spleen: Unremarkable.

Adrenals/Urinary Tract: 8 mm calculus in the proximal third of the
left ureter just beyond the left ureteropelvic junction. This is
associated with mild proximal left hydroureteronephrosis. There are
multiple nonobstructive calculi within the collecting systems of the
kidneys bilaterally measuring up to 8 mm in the interpolar region of
the left kidney. No bladder calculi. Bilateral adrenal glands are
normal in appearance.

Stomach/Bowel: The appearance of the stomach is normal. There is no
pathologic dilatation of small bowel or colon. Several colonic
diverticulae are noted, without surrounding inflammatory changes to
suggest an acute diverticulitis at this time. The appendix is not
confidently identified and may be surgically absent. Regardless,
there are no inflammatory changes noted adjacent to the cecum to
suggest the presence of an acute appendicitis at this time.

Vascular/Lymphatic: No atherosclerotic calcifications or definite
aneurysm identified in the abdominal or pelvic vasculature. No
lymphadenopathy noted in the abdomen or pelvis.

Reproductive: Prostate gland and seminal vesicles are unremarkable
in appearance.

Other: No significant volume of ascites.  No pneumoperitoneum.

Musculoskeletal: There are no aggressive appearing lytic or blastic
lesions noted in the visualized portions of the skeleton.
IMPRESSION: 1. Partially obstructive 8 mm calculus in the proximal third of the
left ureter immediately beyond the left ureteropelvic junction with
mild proximal hydroureteronephrosis.
2. Multiple additional nonobstructive calculi within the collecting
systems of the kidneys bilaterally (left greater than right),
measuring up to 8 mm in the interpolar region.
3. Hepatic steatosis.
4. Mild colonic diverticulosis without evidence of acute
diverticulitis at this time.

## 2017-05-20 IMAGING — DX DG ABDOMEN 1V
2 series · 2 of 2 positions shown · non-contrast
Comparison: None.

CLINICAL DATA: LEFT back pain and flank pain

EXAM:
ABDOMEN - 1 VIEW

[abdomen kub (1 of 2)]
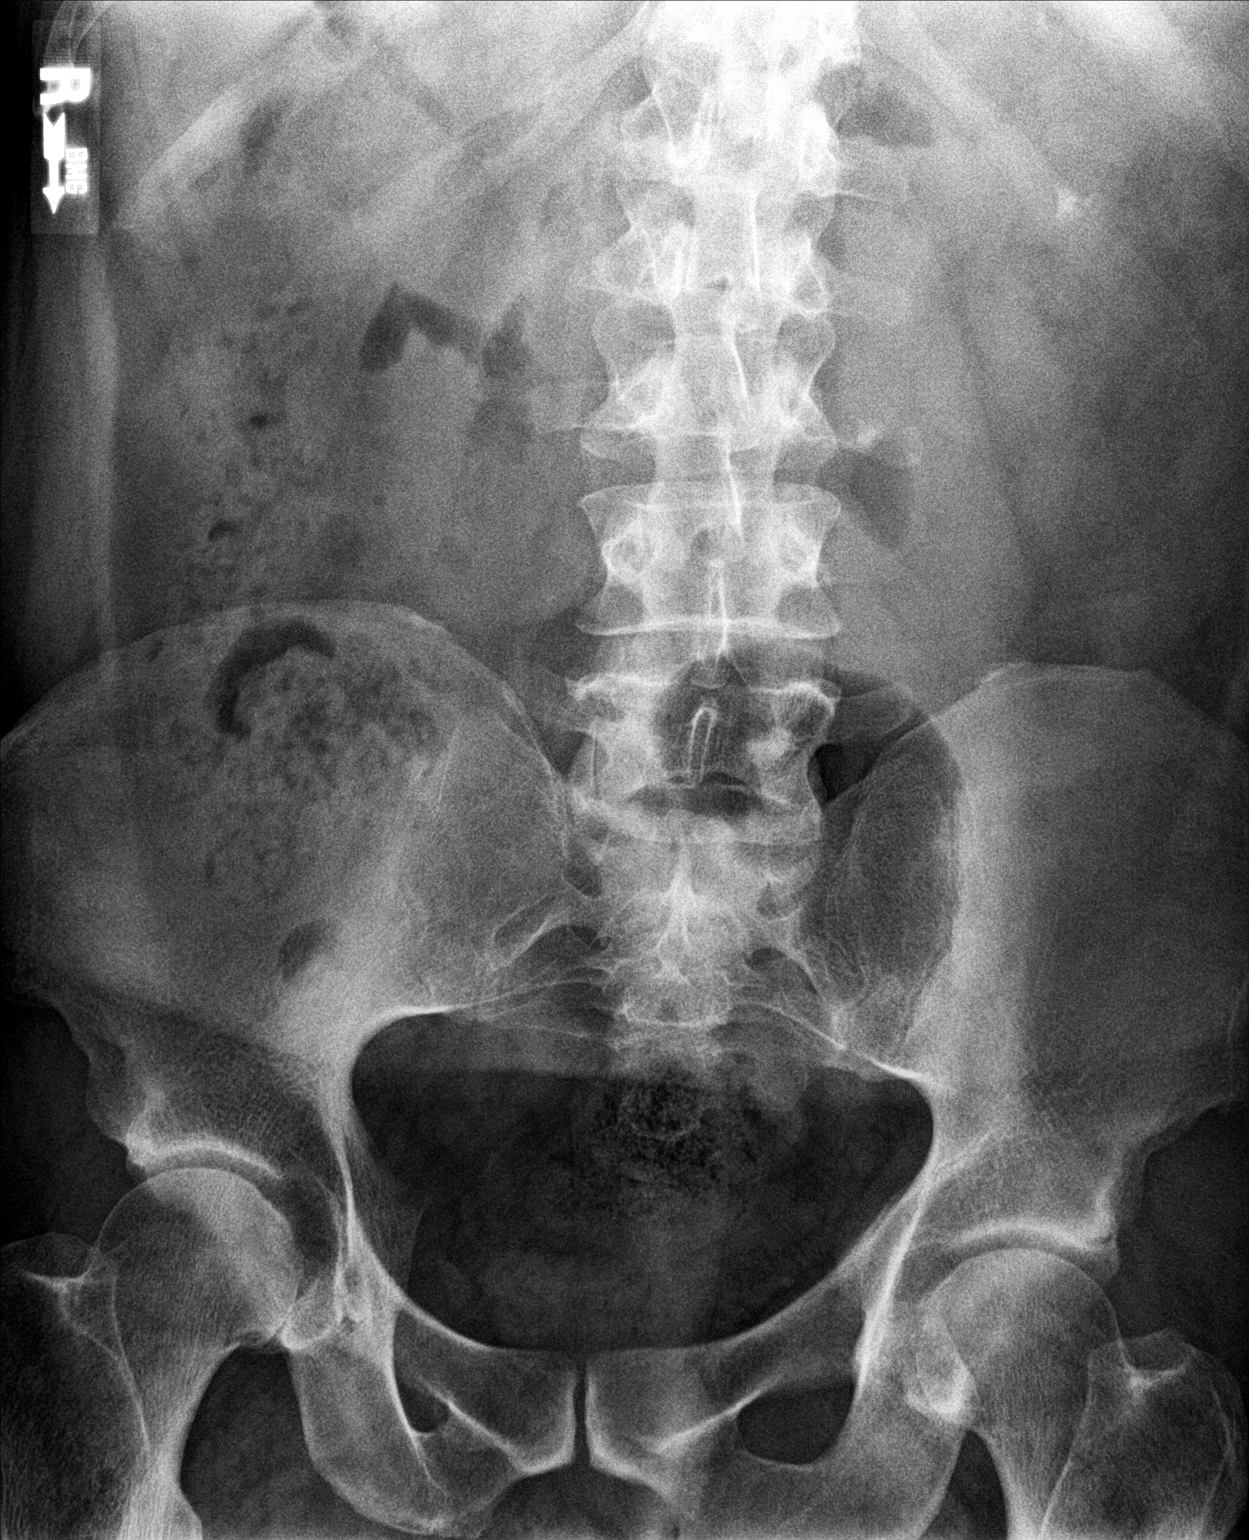

[abdomen kub (2 of 2)]
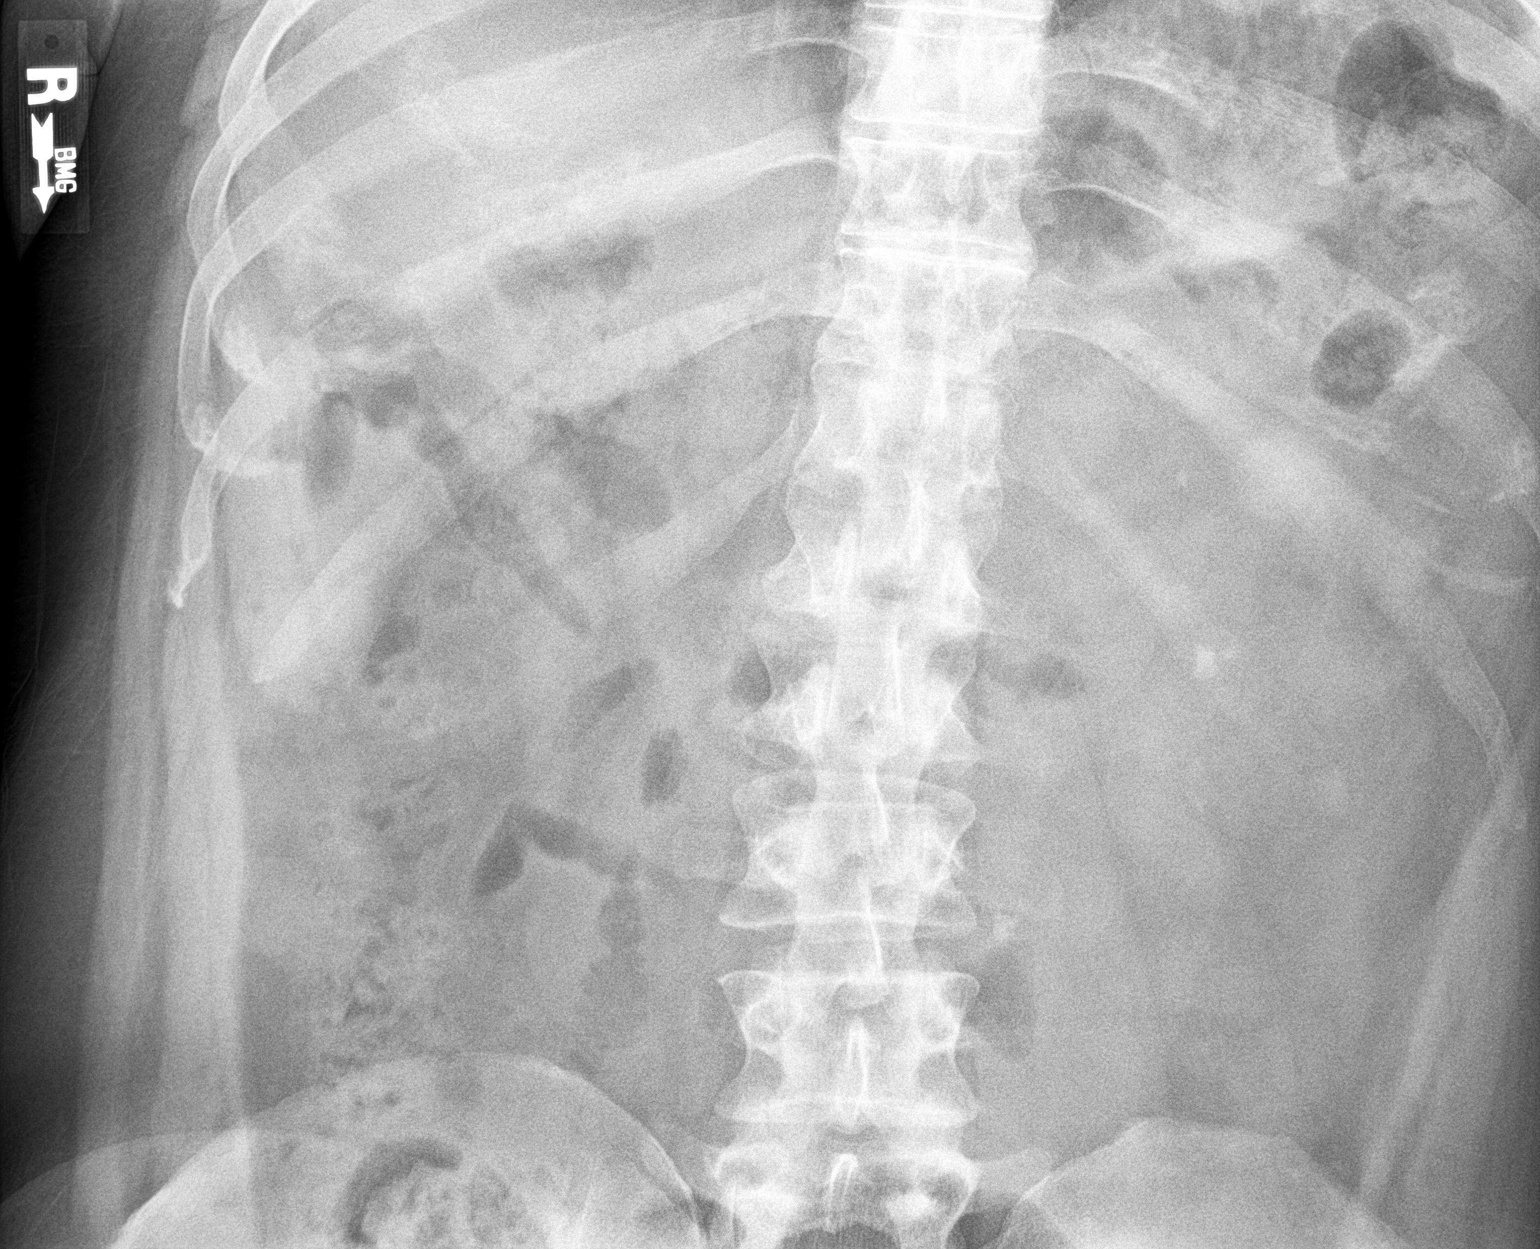

[2 of 2 positions shown; findings below may reference images not displayed]

FINDINGS: 9 mm LEFT renal calculus. a LEFT ureterolithiasis projecting over
the transverse process of the L3 vertebral body. No bladder calculi.
Potential.
IMPRESSION: 1. LEFT nephrolithiasis.
2. Potential LEFT ureteral calculus. Consider CT of the
abdomen/pelvis for further evaluation
